# Patient Record
Sex: Female | Born: 1945 | Race: White | Hispanic: No | Marital: Married | Smoking: Never smoker
Health system: Southern US, Community
[De-identification: ages and names within clinical notes are randomized; demographics above are authoritative.]

## PROBLEM LIST (undated history)

## (undated) DIAGNOSIS — I491 Atrial premature depolarization: Secondary | ICD-10-CM

## (undated) DIAGNOSIS — I447 Left bundle-branch block, unspecified: Secondary | ICD-10-CM

## (undated) DIAGNOSIS — R011 Cardiac murmur, unspecified: Secondary | ICD-10-CM

## (undated) DIAGNOSIS — Z90710 Acquired absence of both cervix and uterus: Secondary | ICD-10-CM

## (undated) DIAGNOSIS — E785 Hyperlipidemia, unspecified: Secondary | ICD-10-CM

## (undated) DIAGNOSIS — I471 Supraventricular tachycardia, unspecified: Secondary | ICD-10-CM

## (undated) DIAGNOSIS — E079 Disorder of thyroid, unspecified: Secondary | ICD-10-CM

## (undated) DIAGNOSIS — J45909 Unspecified asthma, uncomplicated: Secondary | ICD-10-CM

## (undated) DIAGNOSIS — Z87898 Personal history of other specified conditions: Secondary | ICD-10-CM

## (undated) DIAGNOSIS — I493 Ventricular premature depolarization: Secondary | ICD-10-CM

## (undated) DIAGNOSIS — I272 Pulmonary hypertension, unspecified: Secondary | ICD-10-CM

## (undated) HISTORY — DX: Ventricular premature depolarization: I49.3

## (undated) HISTORY — DX: Acquired absence of both cervix and uterus: Z90.710

## (undated) HISTORY — DX: Supraventricular tachycardia: I47.1

## (undated) HISTORY — DX: Atrial premature depolarization: I49.1

## (undated) HISTORY — DX: Personal history of other specified conditions: Z87.898

## (undated) HISTORY — DX: Left bundle-branch block, unspecified: I44.7

## (undated) HISTORY — DX: Disorder of thyroid, unspecified: E07.9

## (undated) HISTORY — DX: Supraventricular tachycardia, unspecified: I47.10

## (undated) HISTORY — DX: Unspecified asthma, uncomplicated: J45.909

## (undated) HISTORY — DX: Cardiac murmur, unspecified: R01.1

## (undated) HISTORY — DX: Pulmonary hypertension, unspecified: I27.20

## (undated) HISTORY — DX: Hyperlipidemia, unspecified: E78.5

---

## 2009-12-29 ENCOUNTER — Encounter: Payer: Self-pay | Admitting: Cardiology

## 2010-01-31 ENCOUNTER — Encounter: Payer: Self-pay | Admitting: Cardiology

## 2010-02-07 ENCOUNTER — Ambulatory Visit: Payer: Self-pay | Admitting: Cardiology

## 2010-02-07 DIAGNOSIS — R011 Cardiac murmur, unspecified: Secondary | ICD-10-CM | POA: Insufficient documentation

## 2010-02-07 DIAGNOSIS — I447 Left bundle-branch block, unspecified: Secondary | ICD-10-CM

## 2010-02-17 ENCOUNTER — Encounter: Payer: Self-pay | Admitting: Cardiology

## 2010-02-17 ENCOUNTER — Ambulatory Visit: Payer: Self-pay

## 2010-02-17 ENCOUNTER — Ambulatory Visit (HOSPITAL_COMMUNITY): Admission: RE | Admit: 2010-02-17 | Discharge: 2010-02-17 | Payer: Self-pay | Admitting: Cardiology

## 2010-02-17 ENCOUNTER — Ambulatory Visit: Payer: Self-pay | Admitting: Cardiology

## 2010-02-23 ENCOUNTER — Telehealth: Payer: Self-pay | Admitting: Cardiology

## 2010-05-18 NOTE — Progress Notes (Signed)
Summary: need something written - clearance for surgery  Phone Note From Other Clinic   Caller: sherry office [phone] fx [phone]  Request: Talk with Nurse Details of Complaint: need something in written pt is medical clear for surgery.  Initial call taken by: Lorne Skeens,  February 23, 2010 4:17 PM  Follow-up for Phone Call        PT CALLING AGAIN REGARDING GETTING SURGIACL CLEARANCE FAXED TO DR.[facility] GNF:AOZHYQ WHEELS [phone] Judie Grieve  February 23, 2010 4:26 PM    Faxed Echo from 02/17/10 over to [facility] @ [phone] [facility] [place]  February 23, 2010 4:51 PM  Appended Document: need something written - clearance for surgery We can write something for them.

## 2010-05-18 NOTE — Assessment & Plan Note (Signed)
Summary: NP6/NEW LBBB/JML   Visit Type:  new pt visit Referring Saddie Sandeen:  Dr. Amanda Pea Primary Niaya Hickok:  Lindwood Qua  CC:  pt here today for abnormal EKG (LBBB)..pt states she has had some sob though she does have asthma...edema/ankles last week....denies any cp.  History of Present Illness: 65 yo with history of asthma, hyperlipidemia, and hypothyroidism presents to cardiology clinic for evaluation of LBBB.  Patient had been set up for an elective carpal tunnel surgery today.  ECG done today pre-operatively showed a LBBB.  She had not had a prior in the [facility] system.  She was referred here today for cardiology evaluation prior to surgery. We called over to her PCP's office and actually obtained an ECG this morning.  The tracings were too light to see well, but the interpretation was left bundle branch block.  This was from 2001.  The doctor's note at the top of the ECG indicated that LBBB was also present on an ECG done in 1992.    Patient has minimal cardiopulmonary symptoms.  She is not very active, however.  She does housework without trouble.  She has never had chest pain or tightness.  She is mildly short of breath after climbing a flight of steps.  She gets occasional "dizzy spells," which sound like orthostatic-type lightheadedness, if she rises too fast from lying or sitting.    ECG: NSR, LBBB  Labs (10/11): HCT 35.6, creatinine 0.42, K 4.0, HDL 68, LDL 77, TSH normal  Current Medications (verified): 1)  Singulair 10 Mg Tabs (Montelukast Sodium) .Marland Kitchen.. 1 Tab At Bedtime 2)  Zyrtec Allergy 10 Mg Tabs (Cetirizine Hcl) .Marland Kitchen.. 1 Tab At Bedtime 3)  Premarin 0.625 Mg Tabs (Estrogens Conjugated) .Marland Kitchen.. 1 Tab At Bedtime 4)  Simvastatin 40 Mg Tabs (Simvastatin) .Marland Kitchen.. 1 Tab At Bedtime 5)  Levothyroxine Sodium 88 Mcg Tabs (Levothyroxine Sodium) .Marland Kitchen.. 1 Tab At Bedtime 6)  Vitamin C 500 Mg Tabs (Ascorbic Acid) .Marland Kitchen.. 1 Tab At Bedtime 7)  Iron 325 (65 Fe) Mg Tabs (Ferrous Sulfate) .Marland Kitchen.. 1 Tab At  Bedtime 8)  Ventolin Hfa 108 (90 Base) Mcg/act Aers (Albuterol Sulfate) .... As Needed 9)  Advair Diskus 250-50 Mcg/dose Aepb (Fluticasone-Salmeterol) .Marland Kitchen.. 1 Puff Two Times A Day  Allergies (verified): 1)  ! Codeine 2)  ! Pcn  Past History:  Past Medical History: 1.  Asthma: Since childhood 2.  Allergic rhinitis 3.  Hyperlipidemia 4.  Hypothyroidism 5.  s/p hysterectomy 6.   h/o vertigo 7.  "heart murmur"  Family History: Father with MI at 61, mother with cancer  Social History: Never smoked.  Works in an Scientist, [employer].  Married, lives in [place] outside of [place].   Vital Signs:  Patient profile:   65 year old female Height:      59 inches Weight:      134.8 pounds BMI:     27.32 Pulse rate:   90 / minute Pulse rhythm:   irregular BP sitting:   98 / 64  (left arm) Cuff size:   large  Vitals Entered By: Danielle Rankin, CMA (February 07, 2010 9:22 AM)  Physical Exam  General:  Well developed, well nourished, in no acute distress. Head:  normocephalic and atraumatic Nose:  no deformity, discharge, inflammation, or lesions Mouth:  Teeth, gums and palate normal. Oral mucosa normal. Neck:  Neck supple, no JVD. No masses, thyromegaly or abnormal cervical nodes. Lungs:  Clear bilaterally to auscultation and percussion. Heart:  Non-displaced PMI, chest  non-tender; regular rate and rhythm, S1, S2 without rubs or gallops. 2/6 systolic crescendo-decrescendo murmur RUSB.  Carotid upstroke normal, no bruit.  Pedals normal pulses. No edema, no varicosities. Abdomen:  Bowel sounds positive; abdomen soft and non-tender without masses, organomegaly, or hernias noted. No hepatosplenomegaly. Extremities:  No clubbing or cyanosis. Neurologic:  Alert and oriented x 3. Skin:  Intact without lesions or rashes. Psych:  Normal affect.   Impression & Recommendations:  Problem # 1:  LBBB (ICD-426.3) The left bundle branch block appears to be old, dating back, it seems,  to at least 10.  She has no significant cardiopulmonary symptoms.  She is able to do > 4 METS of exertion without much trouble.  I do not think that she needs a stress test prior to surgery given her lack of active symptoms.  I think that surgery can be rescheduled.   Problem # 2:  MURMUR (ICD-785.2) Patient has a systolic murmur in the aortic area.  I suspect that this may be aortic stenosis-related, but doubt severe aortic stenosis (sounds like mild AS or aortic sclerosis).  I will get an echocardiogram to assess.    Other Orders: Nuclear Stress Test (Nuc Stress Test) Echocardiogram (Echo)  Patient Instructions: 1)  Your physician has requested that you have an echocardiogram.  Echocardiography is a painless test that uses sound waves to create images of your heart. It provides your doctor with information about the size and shape of your heart and how well your heart's chambers and valves are working.  This procedure takes approximately one hour. There are no restrictions for this procedure. 2)  Your physician recommends that you schedule a follow-up appointment as needed with Dr Shirlee Latch.

## 2010-05-18 NOTE — Letter (Signed)
Summary: Clinic Note  Clinic Note   Imported By: Marylou Mccoy 03/06/2010 10:02:49  _____________________________________________________________________  External Attachment:    Type:   Image     Comment:   External Document

## 2013-09-11 ENCOUNTER — Ambulatory Visit (INDEPENDENT_AMBULATORY_CARE_PROVIDER_SITE_OTHER): Payer: Medicare Other | Admitting: Cardiology

## 2013-09-11 ENCOUNTER — Encounter: Payer: Self-pay | Admitting: *Deleted

## 2013-09-11 ENCOUNTER — Encounter: Payer: Self-pay | Admitting: Cardiology

## 2013-09-11 ENCOUNTER — Encounter (INDEPENDENT_AMBULATORY_CARE_PROVIDER_SITE_OTHER): Payer: Self-pay

## 2013-09-11 ENCOUNTER — Telehealth: Payer: Self-pay | Admitting: *Deleted

## 2013-09-11 VITALS — BP 122/64 | HR 88 | Ht 59.0 in | Wt 134.0 lb

## 2013-09-11 DIAGNOSIS — E785 Hyperlipidemia, unspecified: Secondary | ICD-10-CM

## 2013-09-11 DIAGNOSIS — G4710 Hypersomnia, unspecified: Secondary | ICD-10-CM

## 2013-09-11 DIAGNOSIS — I447 Left bundle-branch block, unspecified: Secondary | ICD-10-CM

## 2013-09-11 DIAGNOSIS — R5383 Other fatigue: Secondary | ICD-10-CM | POA: Insufficient documentation

## 2013-09-11 DIAGNOSIS — I2789 Other specified pulmonary heart diseases: Secondary | ICD-10-CM

## 2013-09-11 DIAGNOSIS — I272 Pulmonary hypertension, unspecified: Secondary | ICD-10-CM

## 2013-09-11 DIAGNOSIS — R5381 Other malaise: Secondary | ICD-10-CM

## 2013-09-11 DIAGNOSIS — R0602 Shortness of breath: Secondary | ICD-10-CM | POA: Insufficient documentation

## 2013-09-11 LAB — BASIC METABOLIC PANEL
BUN: 13 mg/dL (ref 6–23)
CHLORIDE: 104 meq/L (ref 96–112)
CO2: 29 meq/L (ref 19–32)
Calcium: 9.5 mg/dL (ref 8.4–10.5)
Creatinine, Ser: 0.6 mg/dL (ref 0.4–1.2)
GFR: 114.09 mL/min (ref >60.00)
GLUCOSE: 111 mg/dL — AB (ref 70–99)
Potassium: 3.6 mEq/L (ref 3.5–5.1)
Sodium: 138 mEq/L (ref 135–145)

## 2013-09-11 LAB — BRAIN NATRIURETIC PEPTIDE: Pro B Natriuretic peptide (BNP): 15 pg/mL (ref 0.0–100.0)

## 2013-09-11 MED ORDER — DILTIAZEM HCL ER COATED BEADS 180 MG PO CP24: 180.0000 mg | ORAL_CAPSULE | Freq: Every day | ORAL | Status: DC

## 2013-09-11 MED ORDER — ATORVASTATIN CALCIUM 20 MG PO TABS: 20.0000 mg | ORAL_TABLET | Freq: Every day | ORAL | Status: DC

## 2013-09-11 NOTE — Telephone Encounter (Signed)
Pt aware and pharmacy aware. Meds updated and labs ordered.

## 2013-09-11 NOTE — Telephone Encounter (Signed)
Received a call from [facility]. Rx for Diltiazem sent over and there is an interaction with Simvastatin. Advised Dr Mayford Knife out of the office to hold Simvastatin and start Diltiazem until we call back Monday, Larita Fife will let patient know.  Marylene Land will be pharmacist Monday.

## 2013-09-11 NOTE — Progress Notes (Signed)
[address], [address] [place], [place]  [address] Phone: [phone] Fax:  [phone]  Date:  09/11/2013   ID:  Adrienne Gonzales, DOB [birth year], MRN [id number]  PCP:  No primary provider on file.  Cardiologist:  Armanda Magic, MD     History of Present Illness: Adrienne Gonzales is a 68 y.o. female with a recent trip to [place] and starting having fatigue and feeling like she was giving out. She says that her symptoms have improved some since she got home.  She denies any chest pain.  She has asthma so she occasionally has some SOB but it has been stable.   She was also having dizzy spells.  She says that it occurs only for 1-2 seconds and resolves.  Echo showed mild pulmonary HTN with PASP of 45-59mmHg, LVH, diastolic dysfunction, normal LVF EF 55-60^, MAC with trivial MR, dilated LA, normal RVF, mild TR.  She also wore an event monitor which showed NSR with rare PVC's and PAC's and SVT up to 11 beats at 191bpm.  Average HR was 91bpm.  She is now here for further evaluation.  She is feeling better with less fatigue.  She was started on Vasotec for hypertensive heart disease.   Her husband says that she has loud snoring and episodes of gasping for breath.  She occasionally has nonrestorative sleep.     Wt Readings from Last 3 Encounters:  09/11/13 134 lb (60.782 kg)  02/07/10 134 lb 12.8 oz (61.145 kg)     Past Medical History  Diagnosis Date  . Asthma   . Thyroid disease     synthroid  . HLD (hyperlipidemia)   . Heart murmur   . H/O: hysterectomy   . History of vertigo   . LBBB (left bundle branch block)     Current Outpatient Prescriptions  Medication Sig Dispense Refill  . benzonatate (TESSALON) 100 MG capsule Take by mouth 3 (three) times daily as needed for cough.      Marland Kitchen Bioflavonoid Products (ESTER C PO) Take 500 mg by mouth daily.      . budesonide-formoterol (SYMBICORT) 80-4.5 MCG/ACT inhaler Inhale 2 puffs into the lungs 2 (two) times daily.      . cetirizine  (ZYRTEC) 10 MG tablet Take 10 mg by mouth daily.      . Cholecalciferol (VITAMIN D3) 2000 UNITS TABS Take by mouth daily.      Marland Kitchen desonide (DESOWEN) 0.05 % cream Apply topically 2 (two) times daily.      . enalapril (VASOTEC) 5 MG tablet Take 5 mg by mouth daily.      Marland Kitchen estradiol (ESTRACE) 0.5 MG tablet Take 0.5 mg by mouth daily.      . ferrous fumarate (FERROCITE) 325 (106 FE) MG TABS tablet Take 1 tablet by mouth.      . Fluticasone-Salmeterol (ADVAIR) 500-50 MCG/DOSE AEPB Inhale 1 puff into the lungs 2 (two) times daily.      Marland Kitchen ibuprofen (ADVIL,MOTRIN) 200 MG tablet Take 200 mg by mouth every 6 (six) hours as needed.      Marland Kitchen levothyroxine (SYNTHROID, LEVOTHROID) 100 MCG tablet Take 100 mcg by mouth daily before breakfast.      . mometasone (NASONEX) 50 MCG/ACT nasal spray Place 2 sprays into the nose daily.      . montelukast (SINGULAIR) 10 MG tablet Take 10 mg by mouth at bedtime.      . Omega-3 Fatty Acids (FISH OIL) 1200 MG CAPS Take by  mouth daily.      . pantoprazole (PROTONIX) 40 MG tablet Take 40 mg by mouth daily.      . simvastatin (ZOCOR) 40 MG tablet Take 40 mg by mouth daily.       No current facility-administered medications for this visit.    Allergies:    Allergies  Allergen Reactions  . Codeine     REACTION: N/V  . Penicillins     REACTION: N/V    Social History:  The patient  reports that she has never smoked. She does not have any smokeless tobacco history on file. She reports that she does not drink alcohol or use illicit drugs.   Family History:  The patient's family history includes Cancer in her mother; Heart attack in her father.   ROS:  Please see the history of present illness.      All other systems reviewed and negative.   PHYSICAL EXAM: VS:  BP 122/64  Pulse 88  Ht 4\' 11"  (1.499 m)  Wt 134 lb (60.782 kg)  BMI 27.05 kg/m2 Well nourished, well developed, in no acute distress HEENT: normal Neck: no JVD Cardiac:  normal S1, S2; RRR; 2/6 SM at  RUSB Lungs:  clear to auscultation bilaterally, no wheezing, rhonchi or rales Abd: soft, nontender, no hepatomegaly Ext: no edema Skin: warm and dry Neuro:  CNs 2-12 intact, no focal abnormalities noted  EKG:     NSR with LBBB  ASSESSMENT AND PLAN:  1.  Exertional fatigue  - check Stress myoview to rule out ischemia 2.  Chronic LBBB - this was documented back in 2011 and by EKG in 1992 3.  Nonsustained SVT up to 11 beats with PAC's and PVC's on recent heart monitor 4.  Mild to moderate Pulmonary HTN by echo PASP 45-3150mmHg - this may be from underlying diastolic dysfunction but need to consider other etiologies such as chronic PE and sleep apnea.   - Sleep study to rule out OSA - Chest CT angio to rule out chronic PE 5.  Chronic SOB from asthma but may also be exacerbated by her diastolic dysfunction.  I will stop her Vasotec that she was started on and start Cardizem CD 180mg  daily to help with diastolic dysfunction and reduced HR and allow for increased diastolic filling time.  This will also help control her PVC's and PAC's and nonsustained SVT.  I will check a BNP.  Followup with me in 4 weeks   Signed, Armanda Magicraci Xinyi Batton, MD 09/11/2013 10:57 AM

## 2013-09-11 NOTE — Telephone Encounter (Signed)
Jeremy, please advise.  

## 2013-09-11 NOTE — Patient Instructions (Addendum)
Your physician has recommended you make the following change in your medication:   1. STOP VASOTEC.  2. START CARDIZEM CD 180 MG DAILY.   Your physician recommends that you return for lab work TODAY FOR  BNP AND BMET.  Your physician has recommended that you have a sleep study at [facility]. This test records several body functions during sleep, including: brain activity, eye movement, oxygen and carbon dioxide blood levels, heart rate and rhythm, breathing rate and rhythm, the flow of air through your mouth and nose, snoring, body muscle movements, and chest and belly movement.  Your physician has requested that you have en exercise stress myoview. For further information please visit [email or web address] Please follow instruction sheet, as given.  Chest CT Angiography (CTA), is a special type of CT scan that uses a computer to produce multi-dimensional views of major blood vessels throughout the body. In CT angiography, a contrast material is injected through an IV to help visualize the blood vessels  Your physician recommends that you schedule a follow-up appointment in: 4 WEEKS WITH DR. Mayford Knife.

## 2013-09-11 NOTE — Telephone Encounter (Signed)
Patient needs to discontinue simvastatin and change to atorvastatin 20 mg once daily.  This interaction doesn't exist with atorvastatin.  Recheck lipid panel and hepatic panel 6-8 weeks later.  Please notify patient and pharmacy, and set up lab. Thanks.

## 2013-09-14 ENCOUNTER — Inpatient Hospital Stay: Admission: RE | Admit: 2013-09-14 | Payer: Medicare Other | Source: Ambulatory Visit

## 2013-09-15 ENCOUNTER — Ambulatory Visit (INDEPENDENT_AMBULATORY_CARE_PROVIDER_SITE_OTHER)
Admission: RE | Admit: 2013-09-15 | Discharge: 2013-09-15 | Disposition: A | Discharge status: Home / Self Care | Payer: Medicare Other | Source: Ambulatory Visit | Attending: Cardiology | Admitting: Cardiology

## 2013-09-15 DIAGNOSIS — R0602 Shortness of breath: Secondary | ICD-10-CM

## 2013-09-15 DIAGNOSIS — I2789 Other specified pulmonary heart diseases: Secondary | ICD-10-CM

## 2013-09-15 MED ORDER — IOHEXOL 350 MG/ML SOLN
80.0000 mL | Freq: Once | INTRAVENOUS | Status: AC | PRN
  Administered 2013-09-15: 80 mL via INTRAVENOUS

## 2013-09-29 ENCOUNTER — Ambulatory Visit (HOSPITAL_COMMUNITY): Payer: Medicare Other | Attending: Cardiology | Admitting: Radiology

## 2013-09-29 VITALS — BP 86/60 | Ht 59.0 in | Wt 125.0 lb

## 2013-09-29 DIAGNOSIS — Z8249 Family history of ischemic heart disease and other diseases of the circulatory system: Secondary | ICD-10-CM | POA: Insufficient documentation

## 2013-09-29 DIAGNOSIS — R002 Palpitations: Secondary | ICD-10-CM | POA: Insufficient documentation

## 2013-09-29 DIAGNOSIS — R0602 Shortness of breath: Secondary | ICD-10-CM | POA: Insufficient documentation

## 2013-09-29 DIAGNOSIS — R5383 Other fatigue: Secondary | ICD-10-CM

## 2013-09-29 DIAGNOSIS — R0989 Other specified symptoms and signs involving the circulatory and respiratory systems: Secondary | ICD-10-CM | POA: Insufficient documentation

## 2013-09-29 DIAGNOSIS — I447 Left bundle-branch block, unspecified: Secondary | ICD-10-CM

## 2013-09-29 DIAGNOSIS — J45909 Unspecified asthma, uncomplicated: Secondary | ICD-10-CM | POA: Insufficient documentation

## 2013-09-29 DIAGNOSIS — I272 Pulmonary hypertension, unspecified: Secondary | ICD-10-CM

## 2013-09-29 DIAGNOSIS — I4949 Other premature depolarization: Secondary | ICD-10-CM

## 2013-09-29 DIAGNOSIS — R0609 Other forms of dyspnea: Secondary | ICD-10-CM | POA: Insufficient documentation

## 2013-09-29 DIAGNOSIS — R42 Dizziness and giddiness: Secondary | ICD-10-CM | POA: Insufficient documentation

## 2013-09-29 MED ORDER — TECHNETIUM TC 99M SESTAMIBI GENERIC - CARDIOLITE
10.0000 | Freq: Once | INTRAVENOUS | Status: AC | PRN
  Administered 2013-09-29: 10 via INTRAVENOUS

## 2013-09-29 MED ORDER — TECHNETIUM TC 99M SESTAMIBI GENERIC - CARDIOLITE
30.0000 | Freq: Once | INTRAVENOUS | Status: AC | PRN
  Administered 2013-09-29: 30 via INTRAVENOUS

## 2013-09-29 MED ORDER — ADENOSINE (DIAGNOSTIC) 3 MG/ML IV SOLN
0.5600 mg/kg | Freq: Once | INTRAVENOUS | Status: AC
  Administered 2013-09-29: 31.8 mg via INTRAVENOUS

## 2013-09-29 NOTE — Progress Notes (Signed)
[facility] SITE 3 NUCLEAR MED [address] [place], [place] [id number] [phone]    Cardiology Nuclear Med Study  Adrienne Gonzales is a 68 y.o. female     MRN : [id number]     DOB: 02/21/1946  Procedure Date: 09/29/2013  Nuclear Med Background Indication for Stress Test:  Evaluation for Ischemia History:  Asthma and No H/O CAD '15 ECHO: EF 55-60% Cardiac Risk Factors: Family History - CAD, LBBB and Lipids  Symptoms:  Dizziness, DOE, Fatigue, Palpitations and SOB   Nuclear Pre-Procedure Caffeine/Decaff Intake:  None> 12 hrs NPO After: 7:30pm   Lungs:  clear O2 Sat: 98% on room air. IV 0.9% NS with Angio Cath:  22g  IV Site: R Wrist x 1, tolerated well IV Started by:  Irean HongPatsy Suhayla Chisom, RN  Chest Size (in):  36 Cup Size: C  Height: 4\' 11"  (1.499 m)  Weight:  125 lb (56.7 kg)  BMI:  Body mass index is 25.23 kg/(m^2). Tech Comments:  Patient held Cardizem, but took Advair and Symbicort this am. Irean HongPatsy Caci Orren, RN.    Nuclear Med Study 1 or 2 day study: 1 day  Stress Test Type:  Adenosine  Reading MD: N/A  Order Authorizing Provider:  Armanda Magicraci Turner, MD  Resting Radionuclide: Technetium 172m Sestamibi  Resting Radionuclide Dose: 11.0 mCi   Stress Radionuclide:  Technetium 6872m Sestamibi  Stress Radionuclide Dose: 33.0 mCi           Stress Protocol Rest HR: 93 Stress HR: 114  Rest BP: 86/60 Stress BP: 118/70  Exercise Time (min): n/a METS: n/a   Predicted Max HR: 151 bpm % Max HR: 75.5 bpm Rate Pressure Product: [id number]   Dose of Adenosine (mg):  31.8 Dose of Lexiscan: n/a mg  Dose of Atropine (mg): n/a Dose of Dobutamine: n/a mcg/kg/min (at max HR)  Stress Test Technologist: Milana NaSabrina Williams, EMT-P  Nuclear Technologist:  Domenic PoliteStephen Carbone, CNMT     Rest Procedure:  Myocardial perfusion imaging was performed at rest 45 minutes following the intravenous administration of Technetium 7472m Sestamibi. Rest ECG: NSR-LBBB  Stress Procedure:  The patient received IV  adenosine at 140 mcg/kg/min for 4 minutes.  Technetium 2072m Sestamibi was injected at the 2 minute mark and quantitative spect images were obtained after a 45 minute delay. Stress ECG: No significant change from baseline ECG  QPS Raw Data Images:  There was significant bowel loop attenuation along the inferior aspect of stress images, less pronounced on rest images. Stress Images:  There is decreased uptake along the apical/distal anterior/distal inferior distribution however interpretation is markedly limited by bowel loop attenuation obscuring the majority of the inferior wall. Rest Images:  There appears to be decreased relative uptake along the distal anteroseptal, inferoseptal/apical wall distribution. Limited by bowel loop attenuation. Subtraction (SDS):  SDS is reported as 8 however I believe this to be spurious given the large amount of attenuation artifact. Transient Ischemic Dilatation (Normal <1.22):  1.00 Lung/Heart Ratio (Normal <0.45):  0.29  Quantitative Gated Spect Images QGS EDV:  59 ml QGS ESV:  21 ml  Impression Exercise Capacity:  Adenosine study with no exercise. BP Response:  Normal blood pressure response. Clinical Symptoms:  Typical symptoms with adenosine, chest and arm fullness as well as headache ECG Impression:  Baseline:  LBBB.  EKG uninterpretable due to LBBB at rest and stress. Comparison with Prior Nuclear Study: No images to compare  Overall Impression:  Markedly limited study secondary to bowel loop attenuation especially  in stress images obscuring the majority of the inferior wall.    LV Ejection Fraction: 64%.  LV Wall Motion:  Septal wall hypokinesis consistent with left bundle branch block  Donato SchultzSKAINS, MARK, MD

## 2013-10-01 ENCOUNTER — Telehealth: Payer: Self-pay | Admitting: General Surgery

## 2013-10-01 DIAGNOSIS — I4891 Unspecified atrial fibrillation: Secondary | ICD-10-CM

## 2013-10-01 NOTE — Telephone Encounter (Signed)
To Dr Mayford Knifeurner as Lorain ChildesFYI. Pt is set up for Cardioversion and labs prior. THe EKG done today will be on your cart to sign off on to be scanned in.

## 2013-10-05 ENCOUNTER — Telehealth: Payer: Self-pay | Admitting: Cardiology

## 2013-10-05 ENCOUNTER — Other Ambulatory Visit: Payer: Self-pay | Admitting: General Surgery

## 2013-10-05 DIAGNOSIS — I447 Left bundle-branch block, unspecified: Secondary | ICD-10-CM

## 2013-10-05 DIAGNOSIS — R0602 Shortness of breath: Secondary | ICD-10-CM

## 2013-10-05 NOTE — Telephone Encounter (Signed)
New message      [facility] to [facility].  You spoke with the patient earlier today---now the daughter want to talk to you

## 2013-10-05 NOTE — Telephone Encounter (Signed)
Spoke with daughter and explained why pt needed procedure. Pt said it was all right to talk to daughter Larita FifeLynn.

## 2013-10-08 ENCOUNTER — Encounter: Payer: Self-pay | Admitting: Cardiology

## 2013-10-13 ENCOUNTER — Ambulatory Visit (HOSPITAL_COMMUNITY)
Admission: RE | Admit: 2013-10-13 | Discharge: 2013-10-13 | Disposition: A | Discharge status: Home / Self Care | Payer: Medicare Other | Source: Ambulatory Visit | Attending: Cardiology | Admitting: Cardiology

## 2013-10-13 DIAGNOSIS — R0602 Shortness of breath: Secondary | ICD-10-CM

## 2013-10-13 DIAGNOSIS — I447 Left bundle-branch block, unspecified: Secondary | ICD-10-CM

## 2013-10-13 DIAGNOSIS — R5383 Other fatigue: Principal | ICD-10-CM

## 2013-10-13 DIAGNOSIS — R5381 Other malaise: Secondary | ICD-10-CM | POA: Insufficient documentation

## 2013-10-13 MED ORDER — METOPROLOL TARTRATE 1 MG/ML IV SOLN
INTRAVENOUS | Status: AC
  Filled 2013-10-13: qty 5

## 2013-10-13 MED ORDER — NITROGLYCERIN 0.4 MG SL SUBL
SUBLINGUAL_TABLET | SUBLINGUAL | Status: AC
  Administered 2013-10-13: 0.4 mg via SUBLINGUAL
  Filled 2013-10-13: qty 1

## 2013-10-13 MED ORDER — METOPROLOL TARTRATE 1 MG/ML IV SOLN
5.0000 mg | Freq: Once | INTRAVENOUS | Status: AC
  Administered 2013-10-13: 5 mg via INTRAVENOUS
  Filled 2013-10-13: qty 5

## 2013-10-13 MED ORDER — IOHEXOL 350 MG/ML SOLN
80.0000 mL | Freq: Once | INTRAVENOUS | Status: AC | PRN
  Administered 2013-10-13: 80 mL via INTRAVENOUS

## 2013-10-13 MED ORDER — NITROGLYCERIN 0.4 MG SL SUBL
0.4000 mg | SUBLINGUAL_TABLET | SUBLINGUAL | Status: DC | PRN
  Administered 2013-10-13: 0.4 mg via SUBLINGUAL
  Filled 2013-10-13: qty 25

## 2013-10-13 NOTE — Progress Notes (Signed)
Pt moved from radiology nurses station to CT room 1 for CTA. Dr. Delton SeeNelson notified. Heart rate now 74.

## 2013-10-13 NOTE — Progress Notes (Signed)
Pt to nurses station post CTA, tolerated well, eating graham crackers and drinking water without difficulty, IV removed. Husband at side. Discharged to home.

## 2013-10-21 ENCOUNTER — Encounter: Payer: Medicare Other | Admitting: Cardiology

## 2013-10-22 NOTE — Progress Notes (Signed)
This encounter was created in error - please disregard.

## 2013-11-02 ENCOUNTER — Other Ambulatory Visit (INDEPENDENT_AMBULATORY_CARE_PROVIDER_SITE_OTHER): Payer: Medicare Other

## 2013-11-02 DIAGNOSIS — E785 Hyperlipidemia, unspecified: Secondary | ICD-10-CM

## 2013-11-02 LAB — HEPATIC FUNCTION PANEL
ALBUMIN: 3.7 g/dL (ref 3.5–5.2)
ALT: 14 U/L (ref 0–35)
AST: 18 U/L (ref 0–37)
Alkaline Phosphatase: 52 U/L (ref 39–117)
Bilirubin, Direct: 0 mg/dL (ref 0.0–0.3)
TOTAL PROTEIN: 6.5 g/dL (ref 6.0–8.3)
Total Bilirubin: 0.5 mg/dL (ref 0.2–1.2)

## 2013-11-02 LAB — LIPID PANEL
Cholesterol: 145 mg/dL (ref 0–200)
HDL: 63 mg/dL (ref >39.00)
LDL Cholesterol: 70 mg/dL (ref 0–99)
NonHDL: 82
TRIGLYCERIDES: 58 mg/dL (ref 0.0–149.0)
Total CHOL/HDL Ratio: 2
VLDL: 11.6 mg/dL (ref 0.0–40.0)

## 2013-11-03 ENCOUNTER — Encounter: Payer: Self-pay | Admitting: General Surgery

## 2013-11-05 ENCOUNTER — Encounter: Payer: Self-pay | Admitting: Cardiology

## 2013-11-05 ENCOUNTER — Telehealth: Payer: Self-pay | Admitting: Cardiology

## 2013-11-05 NOTE — Telephone Encounter (Signed)
New message ° ° ° ° °Want lab results °

## 2013-11-05 NOTE — Telephone Encounter (Signed)
Lmtrc. Lipid panel and hepatic panel normal. Mailed results.

## 2013-11-16 ENCOUNTER — Ambulatory Visit: Payer: Medicare Other | Admitting: Physician Assistant

## 2013-11-23 ENCOUNTER — Encounter: Payer: Self-pay | Admitting: Physician Assistant

## 2013-11-23 ENCOUNTER — Ambulatory Visit (INDEPENDENT_AMBULATORY_CARE_PROVIDER_SITE_OTHER): Payer: Medicare Other | Admitting: Physician Assistant

## 2013-11-23 VITALS — BP 115/63 | HR 81 | Ht 59.0 in | Wt 131.0 lb

## 2013-11-23 DIAGNOSIS — I471 Supraventricular tachycardia: Secondary | ICD-10-CM | POA: Insufficient documentation

## 2013-11-23 DIAGNOSIS — I498 Other specified cardiac arrhythmias: Secondary | ICD-10-CM

## 2013-11-23 DIAGNOSIS — R0602 Shortness of breath: Secondary | ICD-10-CM

## 2013-11-23 NOTE — Assessment & Plan Note (Signed)
SVT seems to have improved on diltiazem. Continue current medications. Stop Vasotec.

## 2013-11-23 NOTE — Assessment & Plan Note (Signed)
Blood pressure currently controlled.

## 2013-11-23 NOTE — Patient Instructions (Signed)
Your physician recommends that you continue on your current medications as directed. Please refer to the Current Medication list given to you today.  Your physician recommends that you schedule a follow-up appointment in: DR TURNER IN 2 TO 3 MONTHS

## 2013-11-23 NOTE — Progress Notes (Signed)
HPI: This is a 68 year old female patient of Dr. Mayford Knife who had chest pain and had a Lexi scan that was suboptimal due to to increase gut activity. She then had CTA with morphology and calcium score which was 0. She also had SVT up to 11 beats at a rate of 191 beats per minute on the monitor and was started on Cardizem. 2-D echo showed mild pulmonary hypertension with PAS P. of 45-50 mmHg, and LVH, diastolic dysfunction, EF 55-60%.  Patient is here for followup. She denies any further palpitations or dizzy spells. She feels much better. She is a little confused over her medications and accidentally took Vasotec in addition to the diltiazem. She then realized he probably shouldn't be taking both.   Allergies-- Barbiturates -- Nausea Only  -- Ciprofloxacin -- Nausea Only and Nausea And Vomiting   --  SICK ON STOMACH  -- Codeine    --  REACTION: N/V  -- Penicillins    --  REACTION: N/V  -- Percocet [Oxycodone-Acetaminophen]   Current Outpatient Prescriptions on File Prior to Visit: atorvastatin (LIPITOR) 20 MG tablet, Take 1 tablet (20 mg total) by mouth daily., Disp: 30 tablet, Rfl: 6 Bioflavonoid Products (ESTER C PO), Take 500 mg by mouth daily., Disp: , Rfl:  budesonide-formoterol (SYMBICORT) 80-4.5 MCG/ACT inhaler, Inhale 2 puffs into the lungs 2 (two) times daily., Disp: , Rfl:  cetirizine (ZYRTEC) 10 MG tablet, Take 10 mg by mouth daily., Disp: , Rfl:  Cholecalciferol (VITAMIN D3) 2000 UNITS TABS, Take by mouth daily., Disp: , Rfl:  desonide (DESOWEN) 0.05 % cream, Apply topically 2 (two) times daily., Disp: , Rfl:  diltiazem (CARDIZEM CD) 180 MG 24 hr capsule, Take 1 capsule (180 mg total) by mouth daily., Disp: 30 capsule, Rfl: 6 enalapril (VASOTEC) 5 MG tablet, Take 5 mg by mouth., Disp: , Rfl:  estradiol (ESTRACE) 0.5 MG tablet, Take 0.5 mg by mouth daily., Disp: , Rfl:  ferrous fumarate (FERROCITE) 325 (106 FE) MG TABS tablet, Take 1 tablet by mouth., Disp: , Rfl:   Fluticasone-Salmeterol (ADVAIR) 500-50 MCG/DOSE AEPB, Inhale 1 puff into the lungs 2 (two) times daily., Disp: , Rfl:  ibuprofen (ADVIL,MOTRIN) 200 MG tablet, Take 200 mg by mouth every 6 (six) hours as needed., Disp: , Rfl:  levothyroxine (SYNTHROID, LEVOTHROID) 100 MCG tablet, Take 100 mcg by mouth daily before breakfast., Disp: , Rfl:  mometasone (NASONEX) 50 MCG/ACT nasal spray, Place 2 sprays into the nose daily., Disp: , Rfl:  montelukast (SINGULAIR) 10 MG tablet, Take 10 mg by mouth at bedtime., Disp: , Rfl:  Omega-3 Fatty Acids (FISH OIL) 1200 MG CAPS, Take by mouth daily., Disp: , Rfl:  pantoprazole (PROTONIX) 40 MG tablet, Take 40 mg by mouth daily., Disp: , Rfl:  vitamin C (ASCORBIC ACID) 500 MG tablet, Take 500 mg by mouth., Disp: , Rfl:   No current facility-administered medications on file prior to visit.   Past Medical History:   Asthma                                                       Thyroid disease  Comment:synthroid   HLD (hyperlipidemia)                                         Heart murmur                                                 H/O: hysterectomy                                            History of vertigo                                           LBBB (left bundle branch block)                             No past surgical history on file.  Review of patient's family history indicates:   Heart attack                   Father                   Cancer                         Mother                   Social History   Marital Status: Married             Spouse Name:                      Years of Education:                 Number of children:             Occupational History   None on file  Social History Main Topics   Smoking Status: Never Smoker                     Smokeless Status: Not on file                      Alcohol Use: No             Drug Use: No             Sexual Activity: Not on file         Other Topics            Concern   None on file  Social History Narrative   None on file    ROS: See history of present illness otherwise negative   PHYSICAL EXAM: Well-nournished, in no acute distress. Neck: No JVD, HJR, Bruit, or thyroid enlargement  Lungs: No tachypnea, clear without wheezing, rales, or rhonchi  Cardiovascular: RRR, PMI not displaced, 2/6 systolic murmur at the left sternal border, no gallops, bruit, thrill, or heave.  Abdomen: BS normal. Soft without organomegaly, masses, lesions or tenderness.  Extremities: without cyanosis,  clubbing or edema. Good distal pulses bilateral  SKin: Warm, no lesions or rashes   Musculoskeletal: No deformities  Neuro: no focal signs  BP 115/63  Pulse 81  Ht 4\' 11"  (1.499 m)  Wt 131 lb (59.421 kg)  BMI 26.44 kg/m2    EKG: Normal sinus rhythm with left bundle branch block  CTA coronary calcium score 10/05/13 IMPRESSION: 1. Coronary calcium score of 0. This was 0 percentile for age and sex matched control.   2. No evidence of CAD.   Tobias AlexanderKatarina Nelson

## 2013-11-23 NOTE — Assessment & Plan Note (Signed)
Improved. CTA coronary calcium score of 0.

## 2014-02-24 ENCOUNTER — Ambulatory Visit (INDEPENDENT_AMBULATORY_CARE_PROVIDER_SITE_OTHER): Payer: Medicare Other | Admitting: Cardiology

## 2014-02-24 ENCOUNTER — Encounter: Payer: Self-pay | Admitting: Cardiology

## 2014-02-24 VITALS — BP 122/76 | HR 91 | Ht 59.0 in | Wt 126.1 lb

## 2014-02-24 DIAGNOSIS — G4710 Hypersomnia, unspecified: Secondary | ICD-10-CM

## 2014-02-24 DIAGNOSIS — I493 Ventricular premature depolarization: Secondary | ICD-10-CM | POA: Insufficient documentation

## 2014-02-24 DIAGNOSIS — I272 Pulmonary hypertension, unspecified: Secondary | ICD-10-CM

## 2014-02-24 DIAGNOSIS — I471 Supraventricular tachycardia: Secondary | ICD-10-CM

## 2014-02-24 DIAGNOSIS — I270 Primary pulmonary hypertension: Secondary | ICD-10-CM

## 2014-02-24 NOTE — Patient Instructions (Signed)
Your physician recommends that you continue on your current medications as directed. Please refer to the Current Medication list given to you today.  Your physician has recommended that you have a sleep study. This test records several body functions during sleep, including: brain activity, eye movement, oxygen and carbon dioxide blood levels, heart rate and rhythm, breathing rate and rhythm, the flow of air through your mouth and nose, snoring, body muscle movements, and chest and belly movement.  Your physician wants you to follow-up in: 6 months with Dr Turner You will receive a reminder letter in the mail two months in advance. If you don't receive a letter, please call our office to schedule the follow-up appointment.  

## 2014-02-24 NOTE — Progress Notes (Signed)
[address] [place], [place]  [address] Phone: [phone] Fax:  [phone]  Date:  02/24/2014   ID:  Adrienne Gladeancy L Parlin, DOB 06/06/1945, MRN [id number]  PCP:  Mary SellaBrewer, Margaret L, NP  Cardiologist:  Armanda Magicraci Turner, MD    History of Present Illness: Adrienne Gonzales is a 68 y.o. female with a history of asthma, mild pulmonary HTN with PASP of 45-4850mmHg, PVC's and PAC's and SVT up to 11 beats at 191bpm on Holter monitor, hypertension and daytime sleepiness. She is now here for further evaluation. When I last saw her she was complaining of exertional fatigue and Coronary CTA showed no evidence of CAD.  She was started on Cardizem for diastolic dysfunction.  Chest CT showed no evidence of PE.  She denies any chest pain, SOB, dizziness, palpitations or syncope.  She has not had any further episodes of exertional fatigue.  Wt Readings from Last 3 Encounters:  02/24/14 126 lb 1.9 oz (57.208 kg)  11/23/13 131 lb (59.421 kg)  09/29/13 125 lb (56.7 kg)     Past Medical History  Diagnosis Date  . Asthma   . Thyroid disease     synthroid  . HLD (hyperlipidemia)   . Heart murmur   . H/O: hysterectomy   . History of vertigo   . LBBB (left bundle branch block)   . SVT (supraventricular tachycardia)   . PAC (premature atrial contraction)   . PVC (premature ventricular contraction)   . Pulmonary HTN     by echo 2015 moderate PASP 45-2250mmHg    Current Outpatient Prescriptions  Medication Sig Dispense Refill  . albuterol (PROAIR HFA) 108 (90 BASE) MCG/ACT inhaler Inhale into the lungs.    Marland Kitchen. atorvastatin (LIPITOR) 20 MG tablet Take 1 tablet (20 mg total) by mouth daily. 30 tablet 6  . budesonide-formoterol (SYMBICORT) 80-4.5 MCG/ACT inhaler Inhale 2 puffs into the lungs 2 (two) times daily.    . cetirizine (ZYRTEC) 10 MG tablet Take 10 mg by mouth daily.    . Cholecalciferol (VITAMIN D3) 2000 UNITS TABS Take by mouth daily.    Marland Kitchen. desonide (DESOWEN) 0.05 % cream Apply topically 2  (two) times daily.    Marland Kitchen. diltiazem (CARDIZEM CD) 180 MG 24 hr capsule Take 1 capsule (180 mg total) by mouth daily. 30 capsule 6  . estradiol (ESTRACE) 0.5 MG tablet Take 0.5 mg by mouth daily.    Marland Kitchen. estradiol (ESTRACE) 0.5 MG tablet Take 0.5 mg by mouth daily.    . ferrous fumarate (FERROCITE) 325 (106 FE) MG TABS tablet Take 1 tablet by mouth.    . Fluticasone-Salmeterol (ADVAIR) 500-50 MCG/DOSE AEPB Inhale 1 puff into the lungs 2 (two) times daily.    Marland Kitchen. ibuprofen (ADVIL,MOTRIN) 200 MG tablet Take 200 mg by mouth every 6 (six) hours as needed.    Marland Kitchen. levothyroxine (SYNTHROID, LEVOTHROID) 100 MCG tablet Take 100 mcg by mouth daily before breakfast.    . mometasone (NASONEX) 50 MCG/ACT nasal spray Place 2 sprays into the nose daily.    . montelukast (SINGULAIR) 10 MG tablet Take 10 mg by mouth at bedtime.    . Omega-3 Fatty Acids (FISH OIL) 1200 MG CAPS Take by mouth daily.    . pantoprazole (PROTONIX) 40 MG tablet Take 40 mg by mouth daily.    . predniSONE (STERAPRED UNI-PAK) 5 MG TABS tablet Take by mouth.    Marland Kitchen. Bioflavonoid Products (ESTER C PO) Take 500 mg by mouth daily.    .Marland Kitchen  vitamin C (ASCORBIC ACID) 500 MG tablet Take 500 mg by mouth.     No current facility-administered medications for this visit.    Allergies:    Allergies  Allergen Reactions  . Barbiturates Nausea Only  . Ciprofloxacin Nausea Only and Nausea And Vomiting    SICK ON STOMACH  . Codeine     REACTION: N/V  . Penicillins     REACTION: N/V  . Percocet [Oxycodone-Acetaminophen]     Social History:  The patient  reports that she has never smoked. She does not have any smokeless tobacco history on file. She reports that she does not drink alcohol or use illicit drugs.   Family History:  The patient's family history includes Cancer in her mother; Heart attack in her father.   ROS:  Please see the history of present illness.      All other systems reviewed and negative.   PHYSICAL EXAM: VS:  BP 122/76 mmHg  Pulse 91   Ht 4\' 11"  (1.499 m)  Wt 126 lb 1.9 oz (57.208 kg)  BMI 25.46 kg/m2 Well nourished, well developed, in no acute distress HEENT: normal Neck: no JVD Cardiac:  normal S1, S2; RRR; 1/6 SM at RUSB Lungs:  clear to auscultation bilaterally, no wheezing, rhonchi or rales Abd: soft, nontender, no hepatomegaly Ext: no edema Skin: warm and dry Neuro:  CNs 2-12 intact, no focal abnormalities noted  ASSESSMENT AND PLAN:  1. Exertional fatigue with normal nuclear stress test - this has resolved 2. Chronic LBBB - this was documented back in 2011 and by EKG in 1992 3. Nonsustained SVT up to 11 beats with PAC's and PVC's - she denies any palpitations on Cardizem and she will continue on this 4. Mild to moderate Pulmonary HTN by echo PASP 45-1750mmHg with no PE on chest CT- Sleep study was ordered to rule out OSA but she cancelled it because she does not snore.  I have explained to her how OSA can lead to pulmonary HTN and she agrees to proceed with sleep study.   5. Chronic SOB from asthma but may also be exacerbated by her diastolic dysfunction.She is now on Cardizem CD 180mg  daily.     Followup with me in 6 months  Signed, Armanda Magicraci Turner, MD [facility] 02/24/2014 11:07 AM

## 2014-02-24 NOTE — Addendum Note (Signed)
Addended by: Gunnar FusiKEMP, KATHRYN A on: 02/24/2014 12:06 PM   Modules accepted: Orders

## 2014-04-07 ENCOUNTER — Other Ambulatory Visit: Payer: Self-pay | Admitting: Cardiology

## 2014-04-08 NOTE — Telephone Encounter (Signed)
Rx refill sent to patient pharmacy   

## 2014-04-21 ENCOUNTER — Other Ambulatory Visit: Payer: Self-pay | Admitting: Cardiology

## 2014-05-13 ENCOUNTER — Ambulatory Visit (HOSPITAL_BASED_OUTPATIENT_CLINIC_OR_DEPARTMENT_OTHER): Payer: Medicare Other | Attending: Cardiology | Admitting: Radiology

## 2014-05-13 VITALS — Ht 59.0 in | Wt 128.0 lb

## 2014-05-13 DIAGNOSIS — I272 Pulmonary hypertension, unspecified: Secondary | ICD-10-CM

## 2014-05-13 DIAGNOSIS — G4710 Hypersomnia, unspecified: Secondary | ICD-10-CM

## 2014-05-13 DIAGNOSIS — I270 Primary pulmonary hypertension: Secondary | ICD-10-CM | POA: Diagnosis present

## 2014-05-25 ENCOUNTER — Telehealth: Payer: Self-pay | Admitting: Cardiology

## 2014-05-25 DIAGNOSIS — I272 Pulmonary hypertension, unspecified: Secondary | ICD-10-CM

## 2014-05-25 NOTE — Telephone Encounter (Signed)
Please set up repeat echo in May to reassess pulmonary HTN

## 2014-05-25 NOTE — Sleep Study (Signed)
   NAME: Adrienne Gonzales DATE OF BIRTH:  02/19/1946 MEDICAL RECORD NUMBER [id number]  LOCATION: [facility]  PHYSICIAN: Shuayb Schepers R  DATE OF STUDY: 05/13/2014  SLEEP STUDY TYPE: Nocturnal Polysomnogram               REFERRING PHYSICIAN: Quintella Reicherturner, Lynnox Girten R, MD  INDICATION FOR STUDY: Restless sleep and pulmonary HTN  EPWORTH SLEEPINESS SCORE: 2 HEIGHT: 4\' 11"  (149.9 cm)  WEIGHT: 128 lb (58.06 kg)    Body mass index is 25.84 kg/(m^2).  NECK SIZE: 14 in.  MEDICATIONS: Reviewed in the chart  SLEEP ARCHITECTURE: The patient slept for a total of 248 minutes with no slow wave sleep and 40 minutes of REM sleep.  The onset to sleep latency was 10 minutes and onset to REM sleep latency was 120 minutes.  The sleep efficiency was reduced at 71%.    RESPIRATORY DATA: The patient had a total of 9 apneas, of which, 8 were obstructive and 1 was a central event.  There were 9 hypopneas. Most events occurred during REM supine sleep.  There was mild to moderate snoring  OXYGEN DATA: The lowest oxygen saturation during the study was 83% during REM sleep.  The total time spent with O2 saturations <88% was 1.1 minutes.  CARDIAC DATA: The patient maintained NSR with PVC's and PAC's during the study.  MOVEMENT/PARASOMNIA: There were no periodic limb movements or REM behavior sleep disorders during the study.  IMPRESSION/ RECOMMENDATION:   1.  No significant obstructive sleep apnea/hypopnea syndrome with overall AHI in normal range at 4.4 events per hour. 2.  Mild to moderate snoring was noted. 3.  Reduced sleep efficiency with increased frequency of spontaneous arousals. 4.  Minimal O2 desaturations during sleep.   The total time spent with O2 saturations <88% was 1.1 minutes. 5.  Sleep fragmentation with no slow wave sleep and prolonged latency to REM sleep. 6.  The patient should be counseled in good sleep hygiene.   7.  No indication for CPAP therapy at this  time.  Signed: Quintella ReichertURNER,Damyen Knoll R Diplomate, American Board of Sleep Medicine  ELECTRONICALLY SIGNED ON:  05/25/2014, 10:38 AM [facility] PH: [phone]   FX: [phone] ACCREDITED BY THE AMERICAN ACADEMY OF SLEEP MEDICINE

## 2014-05-25 NOTE — Telephone Encounter (Signed)
Please let patient know that she did not have significant sleep apnea.

## 2014-05-26 NOTE — Telephone Encounter (Signed)
Patient informed of sleep study results and verbal understanding expressed.

## 2014-05-26 NOTE — Telephone Encounter (Signed)
Patient agrees to repeat ECHO in May. Ordered for scheduling.

## 2014-05-26 NOTE — Addendum Note (Signed)
Addended by: Gunnar FusiKEMP, Keiron Iodice A on: 05/26/2014 01:49 PM   Modules accepted: Orders

## 2014-05-31 ENCOUNTER — Encounter: Payer: Self-pay | Admitting: Cardiology

## 2014-08-31 ENCOUNTER — Ambulatory Visit (HOSPITAL_COMMUNITY): Payer: Medicare Other | Attending: Cardiovascular Disease

## 2014-08-31 ENCOUNTER — Other Ambulatory Visit (HOSPITAL_COMMUNITY): Payer: Medicare Other

## 2014-08-31 ENCOUNTER — Other Ambulatory Visit: Payer: Self-pay

## 2014-08-31 DIAGNOSIS — I272 Pulmonary hypertension, unspecified: Secondary | ICD-10-CM

## 2014-08-31 DIAGNOSIS — I270 Primary pulmonary hypertension: Secondary | ICD-10-CM

## 2014-09-09 ENCOUNTER — Ambulatory Visit (INDEPENDENT_AMBULATORY_CARE_PROVIDER_SITE_OTHER): Payer: Medicare Other | Admitting: Cardiology

## 2014-09-09 ENCOUNTER — Encounter: Payer: Self-pay | Admitting: Cardiology

## 2014-09-09 VITALS — BP 110/68 | HR 70 | Ht 59.0 in | Wt 126.4 lb

## 2014-09-09 DIAGNOSIS — I270 Primary pulmonary hypertension: Secondary | ICD-10-CM | POA: Diagnosis not present

## 2014-09-09 DIAGNOSIS — R0602 Shortness of breath: Secondary | ICD-10-CM

## 2014-09-09 DIAGNOSIS — I471 Supraventricular tachycardia: Secondary | ICD-10-CM | POA: Diagnosis not present

## 2014-09-09 DIAGNOSIS — I493 Ventricular premature depolarization: Secondary | ICD-10-CM

## 2014-09-09 DIAGNOSIS — I272 Pulmonary hypertension, unspecified: Secondary | ICD-10-CM

## 2014-09-09 NOTE — Patient Instructions (Signed)

## 2014-09-09 NOTE — Progress Notes (Signed)
Cardiology Office Note   Date:  09/09/2014   ID:  Adrienne Gonzales, Adrienne Gonzales 11/27/1945, MRN [id number]  PCP:  Mary Sella, NP    Chief Complaint  Patient presents with  . Follow-up    SVT      History of Present Illness: LELER BRION is a 69 y.o. female with a history of asthma, mild pulmonary HTN with PASP of 45-27mmHg most likely secondary to diastolic dysfunction, PVC's and PAC's and SVT up to 11 beats at 191bpm on Holter monitor, hypertension and daytime sleepiness. She is now here for followup. She denies any chest pain,  dizziness, palpitations or syncope. She has not had any further episodes of exertional fatigue. She has chronic DOE which is stable.      Past Medical History  Diagnosis Date  . Asthma   . Thyroid disease     synthroid  . HLD (hyperlipidemia)   . Heart murmur   . H/O: hysterectomy   . History of vertigo   . LBBB (left bundle branch block)   . SVT (supraventricular tachycardia)   . PAC (premature atrial contraction)   . PVC (premature ventricular contraction)   . Pulmonary HTN     by echo 2015 moderate PASP 45-27mmHg.  Resolved on repeat echo 2016    History reviewed. No pertinent past surgical history.   Current Outpatient Prescriptions  Medication Sig Dispense Refill  . albuterol (PROAIR HFA) 108 (90 BASE) MCG/ACT inhaler Inhale into the lungs.    Marland Kitchen amitriptyline (ELAVIL) 25 MG tablet Take 25 mg by mouth as needed. For restless leg    . atorvastatin (LIPITOR) 20 MG tablet TAKE ONE TABLET BY MOUTH DAILY 30 tablet 6  . Bioflavonoid Products (ESTER C PO) Take 500 mg by mouth daily.    . budesonide-formoterol (SYMBICORT) 80-4.5 MCG/ACT inhaler Inhale 2 puffs into the lungs 2 (two) times daily.    . cetirizine (ZYRTEC) 10 MG tablet Take 10 mg by mouth daily.    . Cholecalciferol (VITAMIN D3) 2000 UNITS TABS Take by mouth daily.    Marland Kitchen desonide (DESOWEN) 0.05 % cream Apply topically 2 (two) times daily.    Marland Kitchen diltiazem (CARDIZEM CD) 180  MG 24 hr capsule TAKE ONE CAPSULE BY MOUTH DAILY 30 capsule 10  . estradiol (ESTRACE) 0.5 MG tablet Take 0.5 mg by mouth daily.    . ferrous fumarate (FERROCITE) 325 (106 FE) MG TABS tablet Take 1 tablet by mouth.    . Fluticasone-Salmeterol (ADVAIR) 500-50 MCG/DOSE AEPB Inhale 1 puff into the lungs 2 (two) times daily.    Marland Kitchen ibuprofen (ADVIL,MOTRIN) 200 MG tablet Take 200 mg by mouth every 6 (six) hours as needed.    Marland Kitchen levothyroxine (SYNTHROID, LEVOTHROID) 100 MCG tablet Take 100 mcg by mouth daily before breakfast.    . mometasone (NASONEX) 50 MCG/ACT nasal spray Place 2 sprays into the nose daily.    . montelukast (SINGULAIR) 10 MG tablet Take 10 mg by mouth at bedtime.    . Omega-3 Fatty Acids (FISH OIL) 1200 MG CAPS Take by mouth daily.    . pantoprazole (PROTONIX) 40 MG tablet Take 40 mg by mouth daily.    . predniSONE (STERAPRED UNI-PAK) 5 MG TABS tablet Take by mouth.    . vitamin C (ASCORBIC ACID) 500 MG tablet Take 500 mg by mouth.     No current facility-administered medications for this visit.    Allergies:   Barbiturates; Ciprofloxacin; Codeine; Penicillins; and Percocet  Social History:  The patient  reports that she has never smoked. She does not have any smokeless tobacco history on file. She reports that she does not drink alcohol or use illicit drugs.   Family History:  The patient's family history includes Cancer in her mother; Heart attack in her father.    ROS:  Please see the history of present illness.   Otherwise, review of systems are positive for none.   All other systems are reviewed and negative.    PHYSICAL EXAM: VS:  BP 110/68 mmHg  Pulse 70  Ht 4\' 11"  (1.499 m)  Wt 126 lb 6.4 oz (57.335 kg)  BMI 25.52 kg/m2  SpO2 96% , BMI Body mass index is 25.52 kg/(m^2). GEN: Well nourished, well developed, in no acute distress HEENT: normal Neck: no JVD, carotid bruits, or masses Cardiac: RRR; no rubs, or gallops,no edema.  2/6 SM at RUSB  Respiratory:  clear  to auscultation bilaterally, normal work of breathing GI: soft, nontender, nondistended, + BS MS: no deformity or atrophy Skin: warm and dry, no rash Neuro:  Strength and sensation are intact Psych: euthymic mood, full affect   EKG:  EKG is not ordered today.    Recent Labs: 09/11/2013: BUN 13; Creatinine 0.6; Potassium 3.6; Pro B Natriuretic peptide (BNP) 15.0; Sodium 138 11/02/2013: ALT 14    Lipid Panel    Component Value Date/Time   CHOL 145 11/02/2013 0950   TRIG 58.0 11/02/2013 0950   HDL 63.00 11/02/2013 0950   CHOLHDL 2 11/02/2013 0950   VLDL 11.6 11/02/2013 0950   LDLCALC 70 11/02/2013 0950      Wt Readings from Last 3 Encounters:  09/09/14 126 lb 6.4 oz (57.335 kg)  05/13/14 128 lb (58.06 kg)  02/24/14 126 lb 1.9 oz (57.208 kg)      ASSESSMENT AND PLAN:  1. Exertional fatigue with normal nuclear stress test - this has resolved 2. Chronic LBBB - this was documented back in 2011 and by EKG in 1992 3. Nonsustained SVT up to 11 beats with PAC's and PVC's - she denies any palpitations on Cardizem and she will continue on this 4. Mild to moderate Pulmonary HTN by echo PASP 45-150mmHg with no PE on chest CT- no sleep apnea on PSG in 2015 but last echo 08/2014 shows resolution of pulmonary HTN 5. Chronic SOB from asthma and chronic diastolic CHF.  This is stable.  She is now on Cardizem CD 180mg  daily.  Current medicines are reviewed at length with the patient today.  The patient does not have concerns regarding medicines.  The following changes have been made:  no change  Labs/ tests ordered today include: see above assessment and plan No orders of the defined types were placed in this encounter.     Disposition:   FU with me in 1 year   Signed, Quintella ReichertURNER,Tishana Clinkenbeard R, MD  09/09/2014 11:36 AM    [facility] [address], [place], [place]  [address] Phone: [phone](336) [phone]; Fax: [phone]) [phone]

## 2015-01-08 ENCOUNTER — Other Ambulatory Visit: Payer: Self-pay | Admitting: Cardiology

## 2015-03-07 NOTE — Progress Notes (Signed)
Cardiology Office Note   Date:  03/08/2015   ID:  Adrienne Gonzales, DOB 1945-08-01, MRN [id number]  PCP:  Marcell Anger, NP    Chief Complaint  Patient presents with  . PVC  . svt  . Shortness of Breath      History of Present Illness: Adrienne Gonzales is a 69y.o. female with a history of asthma, moderate pulmonary HTN with PASP of 45-22mmHg in the past but echo 08/2014 with resolution of pulmonary HTN (this was most likely secondary to diastolic dysfunction), PVC's and PAC's and SVT up to 11 beats at 191bpm on Holter monitor, hypertension and daytime sleepiness but no OSA on sleep study. She is now here for followup. She denies any chest pain, dizziness, palpitations or syncope. She has not had any further episodes of exertional fatigue. She has chronic DOE which is stable. She has has some sinus congestion recently but that is better.      Past Medical History  Diagnosis Date  . Asthma   . Thyroid disease     synthroid  . HLD (hyperlipidemia)   . Heart murmur   . H/O: hysterectomy   . History of vertigo   . LBBB (left bundle branch block)   . SVT (supraventricular tachycardia) (HCC)   . PAC (premature atrial contraction)   . PVC (premature ventricular contraction)   . Pulmonary HTN (HCC)     by echo 2015 moderate PASP 45-61mmHg.  Resolved on repeat echo 2016    No past surgical history on file.   Current Outpatient Prescriptions  Medication Sig Dispense Refill  . atorvastatin (LIPITOR) 20 MG tablet Take 20 mg by mouth daily.  0  . Bioflavonoid Products (ESTER C PO) Take 500 mg by mouth daily.    . budesonide-formoterol (SYMBICORT) 80-4.5 MCG/ACT inhaler Inhale 2 puffs into the lungs 2 (two) times daily.    . cetirizine (ZYRTEC) 10 MG tablet Take 10 mg by mouth daily.    Marland Kitchen diltiazem (CARDIZEM CD) 180 MG 24 hr capsule TAKE ONE CAPSULE BY MOUTH DAILY 30 capsule 10  . estradiol (ESTRACE) 0.5 MG tablet Take 0.5 mg by mouth daily.    .  Fluticasone-Salmeterol (ADVAIR) 500-50 MCG/DOSE AEPB Inhale 1 puff into the lungs 2 (two) times daily.    Marland Kitchen levothyroxine (SYNTHROID, LEVOTHROID) 100 MCG tablet Take 100 mcg by mouth daily before breakfast.    . mometasone (NASONEX) 50 MCG/ACT nasal spray Place 2 sprays into the nose daily.    . montelukast (SINGULAIR) 10 MG tablet Take 10 mg by mouth at bedtime.    . pantoprazole (PROTONIX) 40 MG tablet Take 40 mg by mouth daily.    . vitamin C (ASCORBIC ACID) 500 MG tablet Take 500 mg by mouth.     No current facility-administered medications for this visit.    Allergies:   Barbiturates; Ciprofloxacin; Codeine; Penicillins; and Percocet    Social History:  The patient  reports that she has never smoked. She does not have any smokeless tobacco history on file. She reports that she does not drink alcohol or use illicit drugs.   Family History:  The patient's family history includes Cancer in her mother; Heart attack in her father.    ROS:  Please see the history of present illness.   Otherwise, review of systems are positive for none.   All other systems are reviewed and negative.  PHYSICAL EXAM: VS:  BP 112/68 mmHg  Pulse 82  Ht 4\' 11"  (1.499 m)  Wt 57.97 kg (127 lb 12.8 oz)  BMI 25.80 kg/m2 , BMI Body mass index is 25.8 kg/(m^2). GEN: Well nourished, well developed, in no acute distress HEENT: normal Neck: no JVD, carotid bruits, or masses Cardiac: RRR; no rubs, or gallops,no edema.  2/6 SM at LLSB Respiratory:  clear to auscultation bilaterally, normal work of breathing GI: soft, nontender, nondistended, + BS MS: no deformity or atrophy Skin: warm and dry, no rash Neuro:  Strength and sensation are intact Psych: euthymic mood, full affect   EKG:  EKG was ordered today and showed NSR with PVCs and LBBB    Recent Labs: No results found for requested labs within last 365 days.    Lipid Panel    Component Value Date/Time   CHOL 145 11/02/2013 0950   TRIG 58.0  11/02/2013 0950   HDL 63.00 11/02/2013 0950   CHOLHDL 2 11/02/2013 0950   VLDL 11.6 11/02/2013 0950   LDLCALC 70 11/02/2013 0950      Wt Readings from Last 3 Encounters:  03/08/15 57.97 kg (127 lb 12.8 oz)  09/09/14 57.335 kg (126 lb 6.4 oz)  05/13/14 58.06 kg (128 lb)    ASSESSMENT AND PLAN:  1. Exertional fatigue - Coronary CT calcium score 0 and no CAD 09/2013-resolved 2. Chronic LBBB - this was documented back in 2011 and by EKG in 1992 3. Nonsustained SVT up to 11 beats with PAC's and PVC's - she denies any palpitations on Cardizem and she will continue on this 4. Moderate Pulmonary HTN by echo PASP 45-3350mmHg with no PE on chest CT- no sleep apnea on PSG in 2015 but last echo 08/2014 shows resolution of pulmonary HTN 5. Chronic SOB from asthma and chronic diastolic CHF. This is stable. She is now on Cardizem CD 180mg  daily.    Current medicines are reviewed at length with the patient today.  The patient does not have concerns regarding medicines.  The following changes have been made:  no change  Labs/ tests ordered today: See above Assessment and Plan No orders of the defined types were placed in this encounter.     Disposition:   FU with me in 1 year  Signed, Quintella ReichertURNER,Angad Nabers R, MD  03/08/2015 9:34 AM    [facility] [address], [place], [place]  [address] Phone: [phone]([phone]; Fax: [phone]

## 2015-03-08 ENCOUNTER — Ambulatory Visit (INDEPENDENT_AMBULATORY_CARE_PROVIDER_SITE_OTHER): Payer: Medicare Other | Admitting: Cardiology

## 2015-03-08 ENCOUNTER — Encounter: Payer: Self-pay | Admitting: Cardiology

## 2015-03-08 VITALS — BP 112/68 | HR 82 | Ht 59.0 in | Wt 127.8 lb

## 2015-03-08 DIAGNOSIS — R0602 Shortness of breath: Secondary | ICD-10-CM | POA: Diagnosis not present

## 2015-03-08 DIAGNOSIS — I471 Supraventricular tachycardia: Secondary | ICD-10-CM | POA: Diagnosis not present

## 2015-03-08 DIAGNOSIS — I272 Other secondary pulmonary hypertension: Secondary | ICD-10-CM | POA: Diagnosis not present

## 2015-03-08 DIAGNOSIS — I493 Ventricular premature depolarization: Secondary | ICD-10-CM | POA: Diagnosis not present

## 2015-03-08 MED ORDER — ATORVASTATIN CALCIUM 20 MG PO TABS: 20.0000 mg | ORAL_TABLET | Freq: Every day | ORAL | Status: DC

## 2015-03-08 MED ORDER — DILTIAZEM HCL ER COATED BEADS 180 MG PO CP24
180.0000 mg | ORAL_CAPSULE | Freq: Every day | ORAL | Status: DC
Start: 2015-03-08 — End: 2016-03-15

## 2015-03-08 NOTE — Patient Instructions (Signed)

## 2015-05-16 IMAGING — CT CT HEART MORP W/ CTA COR W/ SCORE W/ CA W/CM &/OR W/O CM
1 of 10 series · 1 of 20 positions shown, 2 images · IV contrast (Iodine)
Comparison: Chest CT 09/15/2013.

CLINICAL DATA: Fatigue and non-conclusive stress test

EXAM:
Cardiac/Coronary  CT
TECHNIQUE: The patient was scanned on a Philips 256 scanner.

[Series 300: locator · axial · 0.35mm/px · z∈[-142,-142]mm · 1 of 1 slices shown, 2 images]
[im 1/1  vessel]
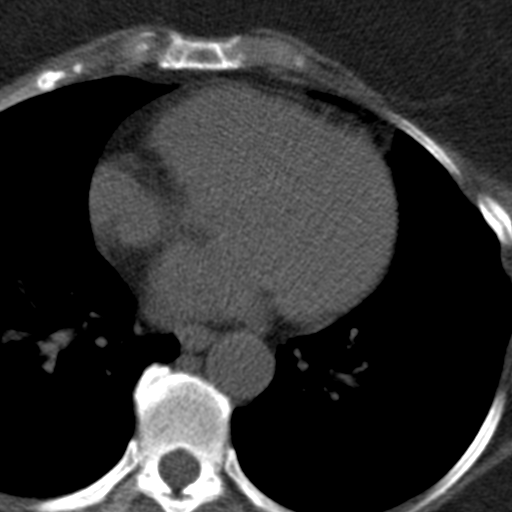
[im 1/1  lung]
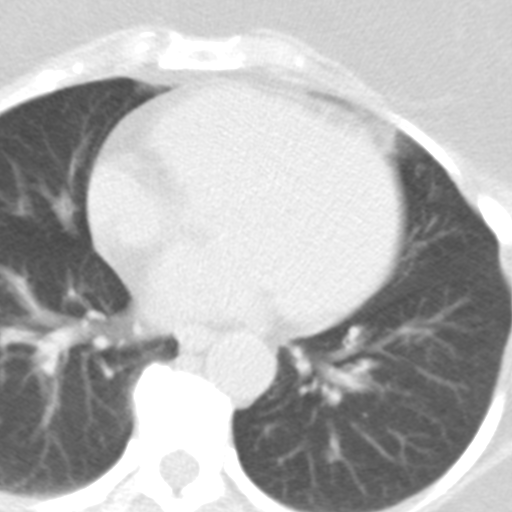

[1 of 20 positions shown; findings below may reference images not displayed]

FINDINGS: A 120 kV prospective scan was triggered in the descending thoracic
aorta at 111 HU's. Axial non-contrast 3 mm slices were carried out
through the heart. The data set was analyzed on a dedicated work
station and scored using the Agatson method. Gantry rotation speed
was 270 msecs and collimation was .9 mm. 2.5 mg iv Metoprolol and
0.4 mg of sl NTG was given. The 3D data set was reconstructed in 5%
intervals of the 67-82 % of the R-R cycle. Diastolic phases were
analyzed on a dedicated work station using MPR, MIP and VRT modes.
The patient received 80 cc of contrast.

Aorta:  No dilatation, no calcification.

Aortic Valve:  Trileaflet, no calcifications.

Coronary Arteries: Originating in a regular position. Right
dominance.

LM - no plague

LAD is a large and long vessel that wraps around the apex and fives
rise to a small diagonal branch. No plague in LAD territory.

LCX is a medium size non-dominant vessel that gives rise to two
obtuse marginal branches. No plague is seen.

RCA is a large dominant vessel that gives rise to PDA and PLVB. No
atherosclerotic plague is seen.
IMPRESSION: 1. Coronary calcium score of 0. This was 0 percentile for age and
sex matched control.

2. No evidence of CAD.

[REDACTED]AL DATA:  The following report is an over-read performed by
10/13/2013. This over-read does not include interpretation of cardiac
or coronary anatomy or pathology. The CTA interpretation by the
cardiologist is attached.
FINDINGS: The visualized mediastinum appears normal without adenopathy. There
is no pleural or pericardial effusion. Previously opacified
left-sided SVC demonstrates limited contrast enhancement on the
current examination.

There is stable mild scarring medially in the right lower lobe. The
lungs are otherwise clear.

Images through the upper abdomen demonstrate a small hiatal hernia
and a 3.5 cm cyst in the upper pole of the left kidney.
IMPRESSION: No acute or significant extra cardiac thoracic findings. Limited
opacification of previously demonstrated persistent left-sided SVC.

## 2015-12-15 ENCOUNTER — Other Ambulatory Visit: Payer: Self-pay | Admitting: Cardiology

## 2015-12-20 ENCOUNTER — Other Ambulatory Visit: Payer: Self-pay | Admitting: Cardiology

## 2015-12-20 ENCOUNTER — Other Ambulatory Visit: Payer: Self-pay

## 2016-03-15 ENCOUNTER — Other Ambulatory Visit: Payer: Self-pay | Admitting: Cardiology

## 2016-03-15 ENCOUNTER — Ambulatory Visit: Payer: Medicare Other | Admitting: Cardiology

## 2016-03-15 MED ORDER — DILTIAZEM HCL ER COATED BEADS 180 MG PO CP24: 180.0000 mg | ORAL_CAPSULE | Freq: Every day | ORAL | 0 refills | Status: DC

## 2016-04-03 ENCOUNTER — Encounter: Payer: Self-pay | Admitting: Cardiology

## 2016-04-03 ENCOUNTER — Encounter (INDEPENDENT_AMBULATORY_CARE_PROVIDER_SITE_OTHER): Payer: Self-pay

## 2016-04-03 ENCOUNTER — Ambulatory Visit (INDEPENDENT_AMBULATORY_CARE_PROVIDER_SITE_OTHER): Payer: Medicare Other | Admitting: Cardiology

## 2016-04-03 VITALS — BP 122/60 | HR 83 | Ht 59.0 in | Wt 128.8 lb

## 2016-04-03 DIAGNOSIS — I447 Left bundle-branch block, unspecified: Secondary | ICD-10-CM | POA: Diagnosis not present

## 2016-04-03 DIAGNOSIS — I471 Supraventricular tachycardia, unspecified: Secondary | ICD-10-CM

## 2016-04-03 DIAGNOSIS — I493 Ventricular premature depolarization: Secondary | ICD-10-CM

## 2016-04-03 NOTE — Patient Instructions (Signed)

## 2016-04-03 NOTE — Progress Notes (Signed)
Cardiology Office Note    Date:  04/03/2016   ID:  Adrienne Gladeancy L Lentz, DOB 07/29/1945, MRN [id number]  PCP:  Marcell Angerandice P Clark, NP  Cardiologist:  Armanda Magicraci Turner, MD   Chief Complaint  Patient presents with  . Follow-up    SVT, PVCs    History of Present Illness:  Adrienne Gonzales is a 70 y.o. female with a history of asthma, moderate pulmonary HTN with PASP of 45-350mmHg in the past but echo 08/2014 showed resolution of pulmonary HTN (this was most likely secondary to diastolic dysfunction), PVC's, chronic LBBB and PAC's and SVT up to 11 beats at 191bpm on Holter monitor, hypertension and daytime sleepiness but no OSA on sleep study. She is now here for followup. She denies any chest pain,SOB, DOE, dizziness, palpitations or syncope.   Past Medical History:  Diagnosis Date  . Asthma   . H/O: hysterectomy   . Heart murmur   . History of vertigo   . HLD (hyperlipidemia)   . LBBB (left bundle branch block)   . PAC (premature atrial contraction)   . Pulmonary HTN    by echo 2015 moderate PASP 45-2950mmHg.  Resolved on repeat echo 2016  . PVC (premature ventricular contraction)   . SVT (supraventricular tachycardia) (HCC)   . Thyroid disease    synthroid    History reviewed. No pertinent surgical history.  Current Medications: Outpatient Medications Prior to Visit  Medication Sig Dispense Refill  . albuterol (PROVENTIL HFA;VENTOLIN HFA) 108 (90 BASE) MCG/ACT inhaler Inhale 1-2 puffs into the lungs every 6 (six) hours as needed for wheezing or shortness of breath.    Marland Kitchen. amitriptyline (ELAVIL) 25 MG tablet Take 25 mg by mouth daily as needed (for restless leg).    Marland Kitchen. atorvastatin (LIPITOR) 20 MG tablet TAKE ONE TABLET EVERY DAY 90 tablet 0  . budesonide-formoterol (SYMBICORT) 80-4.5 MCG/ACT inhaler Inhale 2 puffs into the lungs 2 (two) times daily.    . cetirizine (ZYRTEC) 10 MG tablet Take 10 mg by mouth daily.    . Cholecalciferol (VITAMIN D3) 2000 UNITS TABS Take 1 tablet by  mouth daily.    Marland Kitchen. desonide (DESOWEN) 0.05 % cream Apply 1 application topically 2 (two) times daily.    Marland Kitchen. diltiazem (CARDIZEM CD) 180 MG 24 hr capsule Take 1 capsule (180 mg total) by mouth daily. 90 capsule 0  . estradiol (ESTRACE) 0.5 MG tablet Take 0.5 mg by mouth daily.    . ferrous fumarate (HEMOCYTE - 106 MG FE) 325 (106 FE) MG TABS tablet Take 1 tablet by mouth daily.    . Fluticasone-Salmeterol (ADVAIR) 500-50 MCG/DOSE AEPB Inhale 1 puff into the lungs 2 (two) times daily.    Marland Kitchen. ibuprofen (ADVIL,MOTRIN) 200 MG tablet Take 200 mg by mouth every 6 (six) hours as needed for fever, headache, mild pain, moderate pain or cramping.    Marland Kitchen. levothyroxine (SYNTHROID, LEVOTHROID) 100 MCG tablet Take 100 mcg by mouth daily before breakfast.    . mometasone (NASONEX) 50 MCG/ACT nasal spray Place 2 sprays into the nose daily.    . montelukast (SINGULAIR) 10 MG tablet Take 10 mg by mouth at bedtime.    . pantoprazole (PROTONIX) 40 MG tablet Take 40 mg by mouth daily.    . vitamin C (ASCORBIC ACID) 500 MG tablet Take 500 mg by mouth.    Marland Kitchen. ascorbic acid (VITAMIN C) 500 MG tablet Take 500 mg by mouth daily.    Marland Kitchen. atorvastatin (LIPITOR) 20 MG tablet Take 20  mg by mouth daily.  0  . Bioflavonoid Products (ESTER C PO) Take 500 mg by mouth daily.     No facility-administered medications prior to visit.      Allergies:   Barbiturates; Ciprofloxacin; Codeine; Penicillins; and Percocet [oxycodone-acetaminophen]   Social History   Social History  . Marital status: Married    Spouse name: N/A  . Number of children: N/A  . Years of education: N/A   Social History Main Topics  . Smoking status: Never Smoker  . Smokeless tobacco: Never Used  . Alcohol use No  . Drug use: No  . Sexual activity: Not Asked   Other Topics Concern  . None   Social History Narrative  . None     Family History:  The patient's family history includes Cancer in her mother; Heart attack in her father.   ROS:   Please see  the history of present illness.    ROS All other systems reviewed and are negative.  No flowsheet data found.     PHYSICAL EXAM:   VS:  BP 122/60   Pulse 83   Ht 4\' 11"  (1.499 m)   Wt 128 lb 12.8 oz (58.4 kg)   SpO2 97%   BMI 26.01 kg/m    GEN: Well nourished, well developed, in no acute distress  HEENT: normal  Neck: no JVD, carotid bruits, or masses Cardiac: RRR; no rubs, or gallops,no edema.  Intact distal pulses bilaterally. 2/6 SM at RUSB Respiratory:  clear to auscultation bilaterally, normal work of breathing GI: soft, nontender, nondistended, + BS MS: no deformity or atrophy  Skin: warm and dry, no rash Neuro:  Alert and Oriented x 3, Strength and sensation are intact Psych: euthymic mood, full affect  Wt Readings from Last 3 Encounters:  04/03/16 128 lb 12.8 oz (58.4 kg)  03/08/15 127 lb 12.8 oz (58 kg)  09/09/14 126 lb 6.4 oz (57.3 kg)      Studies/Labs Reviewed:   EKG:  EKG is ordered today.  The ekg ordered today demonstrates NSR with LBBB  Recent Labs: No results found for requested labs within last 8760 hours.   Lipid Panel    Component Value Date/Time   CHOL 145 11/02/2013 0950   TRIG 58.0 11/02/2013 0950   HDL 63.00 11/02/2013 0950   CHOLHDL 2 11/02/2013 0950   VLDL 11.6 11/02/2013 0950   LDLCALC 70 11/02/2013 0950    Additional studies/ records that were reviewed today include:  none    ASSESSMENT:    1. SVT (supraventricular tachycardia) (HCC)   2. PVC (premature ventricular contraction)   3. LBBB (left bundle branch block)      PLAN:  In order of problems listed above:  1. SVT - she has not had any episodes since I saw her last.  Continue Cardizem. 2. PVC's - suppressed on CCB 3. LBBB - unchanged on EKG today.  No evidence of CAD on coronary CTA,    Medication Adjustments/Labs and Tests Ordered: Current medicines are reviewed at length with the patient today.  Concerns regarding medicines are outlined above.  Medication  changes, Labs and Tests ordered today are listed in the Patient Instructions below.  There are no Patient Instructions on file for this visit.   Signed, Armanda Magicraci Turner, MD  04/03/2016 11:27 AM    [facility] [address], [place], [place]  [address] Phone: [phone]; Fax: [phone]

## 2016-06-14 ENCOUNTER — Other Ambulatory Visit: Payer: Self-pay | Admitting: Cardiology

## 2017-03-04 ENCOUNTER — Other Ambulatory Visit: Payer: Self-pay | Admitting: Cardiology

## 2017-04-29 NOTE — Progress Notes (Signed)
Cardiology Office Note:    Date:  04/30/2017   ID:  Wilder Glade, DOB May 11, 1945, MRN [id number]  PCP:  Marcell Anger, NP  Cardiologist:  No primary care provider on file.    Referring MD: Marcell Anger, NP   Chief Complaint  Patient presents with  . Follow-up    LBBB, PVCs, SVT, HTN    History of Present Illness:    Adrienne Gonzales is a 72 y.o. female with a hx of asthma, moderate pulmonary HTN with PASP of 45-66mmHg in the past but echo 08/2014 showed resolution of pulmonary HTN (this was most likely secondary to diastolic dysfunction), PVC's, chronic LBBB and PAC's and SVT up to 11 beats at 191bpm on Holter monitor, hypertension and daytime sleepiness but no OSA on sleep study  She is here today for followup and is doing well.  She denies any chest pain or pressure, SOB, DOE, PND, orthopnea, LE edema, dizziness, palpitations or syncope. She is compliant with her meds and is tolerating meds with no SE.    Past Medical History:  Diagnosis Date  . Asthma   . H/O: hysterectomy   . Heart murmur   . History of vertigo   . HLD (hyperlipidemia)   . LBBB (left bundle branch block)   . PAC (premature atrial contraction)   . Pulmonary HTN (HCC)    by echo 2015 moderate PASP 45-37mmHg.  Resolved on repeat echo 2016  . PVC (premature ventricular contraction)   . SVT (supraventricular tachycardia) (HCC)   . Thyroid disease    synthroid    No past surgical history on file.  Current Medications: Current Meds  Medication Sig  . albuterol (PROVENTIL HFA;VENTOLIN HFA) 108 (90 BASE) MCG/ACT inhaler Inhale 1-2 puffs into the lungs every 6 (six) hours as needed for wheezing or shortness of breath.  Marland Kitchen atorvastatin (LIPITOR) 20 MG tablet TAKE ONE TABLET EVERY DAY  . budesonide-formoterol (SYMBICORT) 80-4.5 MCG/ACT inhaler Inhale 2 puffs into the lungs 2 (two) times daily.  . cetirizine (ZYRTEC) 10 MG tablet Take 10 mg by mouth daily.  Marland Kitchen desonide (DESOWEN) 0.05 % cream Apply 1  application topically 2 (two) times daily.  Marland Kitchen diltiazem (CARDIZEM CD) 180 MG 24 hr capsule TAKE ONE CAPSULE BY MOUTH DAILY  . ferrous fumarate (HEMOCYTE - 106 MG FE) 325 (106 FE) MG TABS tablet Take 1 tablet by mouth daily.  . Fluticasone-Salmeterol (ADVAIR) 500-50 MCG/DOSE AEPB Inhale 1 puff into the lungs 2 (two) times daily.  Marland Kitchen ibuprofen (ADVIL,MOTRIN) 200 MG tablet Take 200 mg by mouth every 6 (six) hours as needed for fever, headache, mild pain, moderate pain or cramping.  Marland Kitchen levothyroxine (SYNTHROID, LEVOTHROID) 100 MCG tablet Take 100 mcg by mouth daily before breakfast.  . mometasone (NASONEX) 50 MCG/ACT nasal spray Place 2 sprays into the nose daily.  . montelukast (SINGULAIR) 10 MG tablet Take 10 mg by mouth at bedtime.  . pantoprazole (PROTONIX) 40 MG tablet Take 40 mg by mouth daily.     Allergies:   Sulfa antibiotics; Percocet [oxycodone-acetaminophen]; Barbiturates; Ciprofloxacin; Codeine; Penicillins; and Propoxyphene   Social History   Socioeconomic History  . Marital status: Married    Spouse name: None  . Number of children: None  . Years of education: None  . Highest education level: None  Social Needs  . Financial resource strain: None  . Food insecurity - worry: None  . Food insecurity - inability: None  . Transportation needs - medical: None  .  Transportation needs - non-medical: None  Occupational History  . None  Tobacco Use  . Smoking status: Never Smoker  . Smokeless tobacco: Never Used  Substance and Sexual Activity  . Alcohol use: No  . Drug use: No  . Sexual activity: None  Other Topics Concern  . None  Social History Narrative  . None     Family History: The patient's family history includes Cancer in her mother; Heart attack in her father.  ROS:   Please see the history of present illness.    ROS  All other systems reviewed and negative.   EKGs/Labs/Other Studies Reviewed:    The following studies were reviewed today: none  EKG:   EKG is  ordered today.  The ekg ordered today demonstrates NSR at 65bpm with LBBB  Recent Labs: No results found for requested labs within last 8760 hours.   Recent Lipid Panel    Component Value Date/Time   CHOL 145 11/02/2013 0950   TRIG 58.0 11/02/2013 0950   HDL 63.00 11/02/2013 0950   CHOLHDL 2 11/02/2013 0950   VLDL 11.6 11/02/2013 0950   LDLCALC 70 11/02/2013 0950    Physical Exam:    VS:  BP 136/74   Pulse 65   Ht 4\' 11"  (1.499 m)   Wt 132 lb (59.9 kg)   BMI 26.66 kg/m     Wt Readings from Last 3 Encounters:  04/30/17 132 lb (59.9 kg)  04/03/16 128 lb 12.8 oz (58.4 kg)  03/08/15 127 lb 12.8 oz (58 kg)     GEN:  Well nourished, well developed in no acute distress HEENT: Normal NECK: No JVD; No carotid bruits LYMPHATICS: No lymphadenopathy CARDIAC: RRR, no murmurs, rubs, gallops RESPIRATORY:  Clear to auscultation without rales, wheezing or rhonchi  ABDOMEN: Soft, non-tender, non-distended MUSCULOSKELETAL:  No edema; No deformity  SKIN: Warm and dry NEUROLOGIC:  Alert and oriented x 3 PSYCHIATRIC:  Normal affect   ASSESSMENT:    1. SVT (supraventricular tachycardia) (HCC)   2. Pulmonary HTN (HCC)   3. PVC (premature ventricular contraction)   4. LBBB (left bundle branch block)    PLAN:    In order of problems listed above:  1.  SVT - she has not had any reoccurrence of her SVT.  She will continue on Cardizem CD 180mg  daily.    2.  Pulmonary HTN - resolved by echo 2016.   3.  PVCs - she is asymptomatic and PVCs suppressed on Cardizem.   4.  LBBB - coronary artery score 0 and no CAD on coronary CTA.  Medication Adjustments/Labs and Tests Ordered: Current medicines are reviewed at length with the patient today.  Concerns regarding medicines are outlined above.  No orders of the defined types were placed in this encounter.  No orders of the defined types were placed in this encounter.   Signed, Armanda Magicraci Turner, MD  04/30/2017 9:21 AM    [facility]

## 2017-04-30 ENCOUNTER — Encounter: Payer: Self-pay | Admitting: Cardiology

## 2017-04-30 ENCOUNTER — Ambulatory Visit: Payer: Medicare Other | Admitting: Cardiology

## 2017-04-30 VITALS — BP 136/74 | HR 65 | Ht 59.0 in | Wt 132.0 lb

## 2017-04-30 DIAGNOSIS — I272 Pulmonary hypertension, unspecified: Secondary | ICD-10-CM | POA: Diagnosis not present

## 2017-04-30 DIAGNOSIS — I447 Left bundle-branch block, unspecified: Secondary | ICD-10-CM

## 2017-04-30 DIAGNOSIS — I471 Supraventricular tachycardia: Secondary | ICD-10-CM

## 2017-04-30 DIAGNOSIS — I493 Ventricular premature depolarization: Secondary | ICD-10-CM | POA: Diagnosis not present

## 2017-04-30 NOTE — Patient Instructions (Signed)

## 2017-06-11 ENCOUNTER — Other Ambulatory Visit: Payer: Self-pay | Admitting: Cardiology

## 2018-02-12 ENCOUNTER — Telehealth: Payer: Self-pay | Admitting: Cardiology

## 2018-02-12 NOTE — Telephone Encounter (Signed)
New Message          Pt c/o medication issue:  1. Name of Medication: Atorvastatin 20 mg  2. How are you currently taking this medication (dosage and times per day)? 1 daily  3. Are you having a reaction (difficulty breathing--STAT)? Cramps in legs  4. What is your medication issue? Cramps in legs, patient wants to try something different

## 2018-02-13 NOTE — Telephone Encounter (Signed)
Spoke with the patient and she accepted to contact PCP about new statin.

## 2018-02-13 NOTE — Telephone Encounter (Signed)
Patient is requesting a different statin due to pain in legs.

## 2018-02-13 NOTE — Telephone Encounter (Signed)
Please have her address this with her PCP.  I have not been following her lipids in 3 years

## 2018-04-10 ENCOUNTER — Encounter: Payer: Self-pay | Admitting: Cardiology

## 2018-05-08 ENCOUNTER — Ambulatory Visit: Payer: Medicare Other | Admitting: Cardiology

## 2018-05-08 ENCOUNTER — Encounter: Payer: Self-pay | Admitting: Cardiology

## 2018-05-08 VITALS — BP 118/68 | HR 68 | Ht 59.0 in | Wt 126.6 lb

## 2018-05-08 DIAGNOSIS — I493 Ventricular premature depolarization: Secondary | ICD-10-CM

## 2018-05-08 DIAGNOSIS — R252 Cramp and spasm: Secondary | ICD-10-CM

## 2018-05-08 DIAGNOSIS — I272 Pulmonary hypertension, unspecified: Secondary | ICD-10-CM | POA: Diagnosis not present

## 2018-05-08 DIAGNOSIS — R011 Cardiac murmur, unspecified: Secondary | ICD-10-CM

## 2018-05-08 DIAGNOSIS — I471 Supraventricular tachycardia: Secondary | ICD-10-CM

## 2018-05-08 DIAGNOSIS — I447 Left bundle-branch block, unspecified: Secondary | ICD-10-CM | POA: Diagnosis not present

## 2018-05-08 NOTE — Progress Notes (Signed)
Cardiology Office Note:    Date:  05/08/2018   ID:  Adrienne Gladeancy L Laye, DOB 03/03/1946, MRN [id number]  PCP:  Marcell Angerlark, Candice P, NP  Cardiologist:  No primary care provider on file.    Referring MD: Marcell Angerlark, Candice P, NP   Chief Complaint  Patient presents with  . Follow-up    Pulmonarhy HTN, PVCs, LBBB    History of Present Illness:    Adrienne Gonzales is a 73 y.o. female with a hx of asthma, moderate pulmonary HTN with PASP of 45-4850mmHg in the past but echo 5/2016showedresolution of pulmonary HTN (this was most likely secondary to diastolic dysfunction), PVC's, chronic LBBBand PAC's and SVT up to 11 beats at 191bpm on Holter monitor, hypertension and daytime sleepiness but no OSA on sleep study.  She is here today for followup and is doing well.  She denies any chest pain or pressure, SOB, DOE, PND, orthopnea, LE edema, dizziness, palpitations or syncope.  She has been complaining of leg cramping recently.  She thought it was her cholesterol medicine and she stopped her Lipitor but hurts symptoms did not resolve.  She says that she gets cramping behind her knee calf but it occurs when she walks and when she is at rest.  She recently went off her statin drug poorly on it.  She is compliant with her meds and is tolerating meds with no SE.    Past Medical History:  Diagnosis Date  . Asthma   . H/O: hysterectomy   . Heart murmur   . History of vertigo   . HLD (hyperlipidemia)   . LBBB (left bundle branch block)   . PAC (premature atrial contraction)   . Pulmonary HTN (HCC)    by echo 2015 moderate PASP 45-5550mmHg.  Resolved on repeat echo 2016  . PVC (premature ventricular contraction)   . SVT (supraventricular tachycardia) (HCC)   . Thyroid disease    synthroid    History reviewed. No pertinent surgical history.  Current Medications: Current Meds  Medication Sig  . albuterol (PROVENTIL HFA;VENTOLIN HFA) 108 (90 BASE) MCG/ACT inhaler Inhale 1-2 puffs into the lungs every 6  (six) hours as needed for wheezing or shortness of breath.  . budesonide-formoterol (SYMBICORT) 80-4.5 MCG/ACT inhaler Inhale 2 puffs into the lungs 2 (two) times daily.  . cetirizine (ZYRTEC) 10 MG tablet Take 10 mg by mouth daily.  . Coenzyme Q10 (CO Q-10 PO) Take 1 tablet by mouth daily.  Marland Kitchen. desonide (DESOWEN) 0.05 % cream Apply 1 application topically 2 (two) times daily.  Marland Kitchen. diltiazem (CARDIZEM CD) 180 MG 24 hr capsule TAKE ONE CAPSULE BY MOUTH DAILY  . ferrous fumarate (HEMOCYTE - 106 MG FE) 325 (106 FE) MG TABS tablet Take 1 tablet by mouth daily.  . Fluticasone-Salmeterol (ADVAIR) 500-50 MCG/DOSE AEPB Inhale 1 puff into the lungs 2 (two) times daily.  Marland Kitchen. ibuprofen (ADVIL,MOTRIN) 200 MG tablet Take 200 mg by mouth every 6 (six) hours as needed for fever, headache, mild pain, moderate pain or cramping.  Marland Kitchen. levothyroxine (SYNTHROID, LEVOTHROID) 100 MCG tablet Take 100 mcg by mouth daily before breakfast.  . mometasone (NASONEX) 50 MCG/ACT nasal spray Place 2 sprays into the nose daily.  . montelukast (SINGULAIR) 10 MG tablet Take 10 mg by mouth at bedtime.  . pantoprazole (PROTONIX) 40 MG tablet Take 40 mg by mouth daily.     Allergies:   Sulfa antibiotics; Percocet [oxycodone-acetaminophen]; Barbiturates; Ciprofloxacin; Codeine; Penicillins; and Propoxyphene   Social History   Socioeconomic History  .  Marital status: Married    Spouse name: Not on file  . Number of children: Not on file  . Years of education: Not on file  . Highest education level: Not on file  Occupational History  . Not on file  Social Needs  . Financial resource strain: Not on file  . Food insecurity:    Worry: Not on file    Inability: Not on file  . Transportation needs:    Medical: Not on file    Non-medical: Not on file  Tobacco Use  . Smoking status: Never Smoker  . Smokeless tobacco: Never Used  Substance and Sexual Activity  . Alcohol use: No  . Drug use: No  . Sexual activity: Not on file    Lifestyle  . Physical activity:    Days per week: Not on file    Minutes per session: Not on file  . Stress: Not on file  Relationships  . Social connections:    Talks on phone: Not on file    Gets together: Not on file    Attends religious service: Not on file    Active member of club or organization: Not on file    Attends meetings of clubs or organizations: Not on file    Relationship status: Not on file  Other Topics Concern  . Not on file  Social History Narrative  . Not on file     Family History: The patient's family history includes Cancer in her mother; Heart attack in her father.  ROS:   Please see the history of present illness.    ROS  All other systems reviewed and negative.   EKGs/Labs/Other Studies Reviewed:    The following studies were reviewed today: none  EKG:  EKG is  ordered today.  The ekg ordered today demonstrates normal sinus rhythm with left bundle branch block  Recent Labs: No results found for requested labs within last 8760 hours.   Recent Lipid Panel    Component Value Date/Time   CHOL 145 11/02/2013 0950   TRIG 58.0 11/02/2013 0950   HDL 63.00 11/02/2013 0950   CHOLHDL 2 11/02/2013 0950   VLDL 11.6 11/02/2013 0950   LDLCALC 70 11/02/2013 0950    Physical Exam:    VS:  BP 118/68   Pulse 68   Ht 4\' 11"  (1.499 m)   Wt 126 lb 9.6 oz (57.4 kg)   BMI 25.57 kg/m     Wt Readings from Last 3 Encounters:  05/08/18 126 lb 9.6 oz (57.4 kg)  04/30/17 132 lb (59.9 kg)  04/03/16 128 lb 12.8 oz (58.4 kg)     GEN:  Well nourished, well developed in no acute distress HEENT: Normal NECK: No JVD; No carotid bruits LYMPHATICS: No lymphadenopathy CARDIAC: RRR, no rubs, gallops.  2/6 systolic murmur the right upper sternal border with radiation to left lower sternal border. RESPIRATORY:  Clear to auscultation without rales, wheezing or rhonchi  ABDOMEN: Soft, non-tender, non-distended MUSCULOSKELETAL:  No edema; No deformity  SKIN: Warm  and dry NEUROLOGIC:  Alert and oriented x 3 PSYCHIATRIC:  Normal affect   ASSESSMENT:    1. PVC (premature ventricular contraction)   2. SVT (supraventricular tachycardia) (HCC)   3. Pulmonary HTN (HCC)   4. LBBB (left bundle branch block)   5. MURMUR   6. Exercise-induced leg cramps    PLAN:    In order of problems listed above:  1.  PVC - these are asymptomatic. Continue on CCB.  2.  SVT - she has not had any palpitations recently.  She will continue on Cardizem CD 180mg  daily  3.  Pulmonary HTN - resolved by echo 2016   4.  LBBB - coronary artery Ca score 0 and no CAD on coronary CTA.  5.  Heart murmur -I will check a 2D echocardiogram  6.  Leg cramps - She says that she gets cramping behind her knee calf but it occurs when she walks and when she is at rest.  It did not resolve when she stopped her statin drug.  She has decreased pulses in her posterior tibial artery so I will get lower extremity ABIs with and without exercise.  Medication Adjustments/Labs and Tests Ordered: Current medicines are reviewed at length with the patient today.  Concerns regarding medicines are outlined above.  Orders Placed This Encounter  Procedures  . EKG 12-Lead   No orders of the defined types were placed in this encounter.   Signed, Armanda Magic, MD  05/08/2018 10:38 AM    [facility]

## 2018-05-08 NOTE — Patient Instructions (Signed)
Medication Instructions:  Your physician recommends that you continue on your current medications as directed. Please refer to the Current Medication list given to you today.  If you need a refill on your cardiac medications before your next appointment, please call your pharmacy.   Lab work: None If you have labs (blood work) drawn today and your tests are completely normal, you will receive your results only by: Marland Kitchen MyChart Message (if you have MyChart) OR . A paper copy in the mail If you have any lab test that is abnormal or we need to change your treatment, we will call you to review the results.  Testing/Procedures: Your physician has requested that you have an ankle brachial index (ABI) with and without exercise. During this test an ultrasound and blood pressure cuff are used to evaluate the arteries that supply the arms and legs with blood. Allow thirty minutes for this exam. There are no restrictions or special instructions.  Your physician has requested that you have an echocardiogram. Echocardiography is a painless test that uses sound waves to create images of your heart. It provides your doctor with information about the size and shape of your heart and how well your heart's chambers and valves are working. This procedure takes approximately one hour. There are no restrictions for this procedure.  Follow-Up: At [facility], you and your health needs are our priority.  As part of our continuing mission to provide you with exceptional heart care, we have created designated Provider Care Teams.  These Care Teams include your primary Cardiologist (physician) and Advanced Practice Providers (APPs -  Physician Assistants and Nurse Practitioners) who all work together to provide you with the care you need, when you need it. You will need a follow up appointment in 1 years.  Please call our office 2 months in advance to schedule this appointment.  You may see Dr. Mayford Knife or one of the  following Advanced Practice Providers on your designated Care Team:   [place], PA-C Ronie Spies, PA-C . Jacolyn Reedy, PA-C

## 2018-05-15 ENCOUNTER — Other Ambulatory Visit: Payer: Self-pay | Admitting: Cardiology

## 2018-05-15 DIAGNOSIS — R252 Cramp and spasm: Secondary | ICD-10-CM

## 2018-05-22 ENCOUNTER — Ambulatory Visit (HOSPITAL_COMMUNITY)
Admission: RE | Admit: 2018-05-22 | Discharge: 2018-05-22 | Disposition: A | Discharge status: Home / Self Care | Payer: Medicare Other | Source: Ambulatory Visit | Attending: Internal Medicine | Admitting: Internal Medicine

## 2018-05-22 ENCOUNTER — Ambulatory Visit (HOSPITAL_BASED_OUTPATIENT_CLINIC_OR_DEPARTMENT_OTHER): Payer: Medicare Other

## 2018-05-22 DIAGNOSIS — I071 Rheumatic tricuspid insufficiency: Secondary | ICD-10-CM | POA: Insufficient documentation

## 2018-05-22 DIAGNOSIS — R011 Cardiac murmur, unspecified: Secondary | ICD-10-CM

## 2018-05-22 DIAGNOSIS — I447 Left bundle-branch block, unspecified: Secondary | ICD-10-CM | POA: Diagnosis not present

## 2018-05-22 DIAGNOSIS — I272 Pulmonary hypertension, unspecified: Secondary | ICD-10-CM | POA: Diagnosis not present

## 2018-05-22 DIAGNOSIS — I10 Essential (primary) hypertension: Secondary | ICD-10-CM | POA: Insufficient documentation

## 2018-05-22 DIAGNOSIS — R252 Cramp and spasm: Secondary | ICD-10-CM

## 2018-05-22 DIAGNOSIS — E785 Hyperlipidemia, unspecified: Secondary | ICD-10-CM | POA: Insufficient documentation

## 2018-05-29 ENCOUNTER — Other Ambulatory Visit: Payer: Self-pay | Admitting: Cardiology

## 2018-09-15 ENCOUNTER — Telehealth: Payer: Self-pay | Admitting: *Deleted

## 2018-09-15 NOTE — Telephone Encounter (Signed)
   [facility] Pre-operative Risk Assessment    Request for surgical clearance:  1. What type of surgery is being performed? RIGHT TOTAL KNEE ARTHROPLASTY  2. When is this surgery scheduled? TBD  3. What type of clearance is required (medical clearance vs. Pharmacy clearance to hold med vs. Both)? MEDICAL  4. Are there any medications that need to be held prior to surgery and how long? NONE LISTED  5. Practice name and name of physician performing surgery? [facility] ; DR/ JEFFREY YASTE  6. What is your office phone number [phone]   7.   What is your office fax number [phone]  8.   Anesthesia type (None, local, MAC, general) ? GENERAL   Adrienne Gonzales 09/15/2018, 2:10 PM  _________________________________________________________________   (provider comments below)

## 2018-09-15 NOTE — Telephone Encounter (Signed)
   Primary Cardiologist: Armanda Magic, MD  Chart reviewed as part of pre-operative protocol coverage. Patient was contacted 09/15/2018 in reference to pre-operative risk assessment for pending surgery as outlined below.  ALAYHA EDELMAN was last seen 05/08/18 by Dr. Mayford Knife. She has h/o asthma, pulm HTN (improved), PVCs, chronic LBBB, PACs, PSVT, HTN, daytime sleepiness, leg pain. Cardiac CT in 2015 showed no CAD. 2D echo 05/2018 EF 55-60%, impaired diastolic relaxation, RVSP normal. RCRI 0.4% indicating low risk of CV complications. I spoke with patient who affirms no new cardiac symptoms since last OV. Able to complete >4 METS without angina. Therefore, based on ACC/AHA guidelines, the patient would be at acceptable risk for the planned procedure without further cardiovascular testing.   I will route this recommendation to the requesting party via Epic fax function and remove from pre-op pool.  Please call with questions.  Laurann Montana, PA-C 09/15/2018, 3:35 PM

## 2019-05-07 NOTE — Progress Notes (Signed)
Cardiology Office Note:    Date:  05/08/2019   ID:  Adrienne Gonzales, DOB 1945-07-09, MRN [id number]  PCP:  Gennette Pac, NP  Cardiologist:  Fransico Him, MD    Referring MD: Gennette Pac, NP   Chief Complaint  Patient presents with  . Follow-up    PVCs, SVT, Pulmonary HTN, LBBB    History of Present Illness:    Adrienne Gonzales is a 74 y.o. female with a hx of asthma, moderate pulmonary HTN with PASP of 45-36mmHg in the past but echo 5/2016showedresolution of pulmonary HTN (this was most likely secondary to diastolic dysfunction), PVC's, chronic LBBBand PAC's and SVT up to 11 beats at 191bpm on Holter monitor, hypertension and daytime sleepiness but no OSA on sleep study.    She is here today for followup and is doing well.  She denies any chest pain or pressure, SOB, DOE, PND, orthopnea, LE edema, dizziness, palpitations or syncope. She is compliant with her meds and is tolerating meds with no SE.    Past Medical History:  Diagnosis Date  . Asthma   . H/O: hysterectomy   . Heart murmur   . History of vertigo   . HLD (hyperlipidemia)   . LBBB (left bundle branch block)   . PAC (premature atrial contraction)   . Pulmonary HTN ([place])    by echo 2015 moderate PASP 45-73mmHg.  Resolved on repeat echo 2016  . PVC (premature ventricular contraction)   . SVT (supraventricular tachycardia) ([place])   . Thyroid disease    synthroid    No past surgical history on file.  Current Medications: Current Meds  Medication Sig  . albuterol (PROVENTIL HFA;VENTOLIN HFA) 108 (90 BASE) MCG/ACT inhaler Inhale 1-2 puffs into the lungs every 6 (six) hours as needed for wheezing or shortness of breath.  . budesonide-formoterol (SYMBICORT) 80-4.5 MCG/ACT inhaler Inhale 2 puffs into the lungs 2 (two) times daily.  . cetirizine (ZYRTEC) 10 MG tablet Take 10 mg by mouth daily.  . Coenzyme Q10 (CO Q-10 PO) Take 1 tablet by mouth daily.  Marland Kitchen desonide (DESOWEN) 0.05 % cream Apply 1 application  topically 2 (two) times daily.  Marland Kitchen diltiazem (CARDIZEM CD) 180 MG 24 hr capsule TAKE ONE CAPSULE BY MOUTH DAILY  . ferrous fumarate (HEMOCYTE - 106 MG FE) 325 (106 FE) MG TABS tablet Take 1 tablet by mouth daily.  . Fluticasone-Salmeterol (ADVAIR) 500-50 MCG/DOSE AEPB Inhale 1 puff into the lungs 2 (two) times daily.  Marland Kitchen ibuprofen (ADVIL,MOTRIN) 200 MG tablet Take 200 mg by mouth every 6 (six) hours as needed for fever, headache, mild pain, moderate pain or cramping.  Marland Kitchen levothyroxine (SYNTHROID) 88 MCG tablet Take 88 mcg by mouth daily before breakfast.  . mometasone (NASONEX) 50 MCG/ACT nasal spray Place 2 sprays into the nose daily.  . montelukast (SINGULAIR) 10 MG tablet Take 10 mg by mouth at bedtime.  . pantoprazole (PROTONIX) 40 MG tablet Take 40 mg by mouth daily.     Allergies:   Sulfa antibiotics, Percocet [oxycodone-acetaminophen], Barbiturates, Ciprofloxacin, Codeine, Penicillins, and Propoxyphene   Social History   Socioeconomic History  . Marital status: Married    Spouse name: Not on file  . Number of children: Not on file  . Years of education: Not on file  . Highest education level: Not on file  Occupational History  . Not on file  Tobacco Use  . Smoking status: Never Smoker  . Smokeless tobacco: Never Used  Substance and Sexual  Activity  . Alcohol use: No  . Drug use: No  . Sexual activity: Not on file  Other Topics Concern  . Not on file  Social History Narrative  . Not on file   Social Determinants of Health   Financial Resource Strain:   . Difficulty of Paying Living Expenses: Not on file  Food Insecurity:   . Worried About Programme researcher, broadcasting/film/video in the Last Year: Not on file  . Ran Out of Food in the Last Year: Not on file  Transportation Needs:   . Lack of Transportation (Medical): Not on file  . Lack of Transportation (Non-Medical): Not on file  Physical Activity:   . Days of Exercise per Week: Not on file  . Minutes of Exercise per Session: Not on  file  Stress:   . Feeling of Stress : Not on file  Social Connections:   . Frequency of Communication with Friends and Family: Not on file  . Frequency of Social Gatherings with Friends and Family: Not on file  . Attends Religious Services: Not on file  . Active Member of Clubs or Organizations: Not on file  . Attends Banker Meetings: Not on file  . Marital Status: Not on file     Family History: The patient's family history includes Cancer in her mother; Heart attack in her father.  ROS:   Please see the history of present illness.    ROS  All other systems reviewed and negative.   EKGs/Labs/Other Studies Reviewed:    The following studies were reviewed today: EKG, labs  EKG:  EKG is  ordered today.  The ekg ordered today demonstrates NSR with LBBB  Recent Labs: No results found for requested labs within last 8760 hours.   Recent Lipid Panel    Component Value Date/Time   CHOL 145 11/02/2013 0950   TRIG 58.0 11/02/2013 0950   HDL 63.00 11/02/2013 0950   CHOLHDL 2 11/02/2013 0950   VLDL 11.6 11/02/2013 0950   LDLCALC 70 11/02/2013 0950    Physical Exam:    VS:  BP 122/70   Pulse 69   Ht 4\' 11"  (1.499 m)   Wt 119 lb 12.8 oz (54.3 kg)   SpO2 96%   BMI 24.20 kg/m     Wt Readings from Last 3 Encounters:  05/08/19 119 lb 12.8 oz (54.3 kg)  05/08/18 126 lb 9.6 oz (57.4 kg)  04/30/17 132 lb (59.9 kg)     GEN:  Well nourished, well developed in no acute distress HEENT: Normal NECK: No JVD; No carotid bruits LYMPHATICS: No lymphadenopathy CARDIAC: RRR, no murmurs, rubs, gallops RESPIRATORY:  Clear to auscultation without rales, wheezing or rhonchi  ABDOMEN: Soft, non-tender, non-distended MUSCULOSKELETAL:  No edema; No deformity  SKIN: Warm and dry NEUROLOGIC:  Alert and oriented x 3 PSYCHIATRIC:  Normal affect   ASSESSMENT:    1. PVC (premature ventricular contraction)   2. SVT (supraventricular tachycardia) (HCC)   3. Pulmonary HTN (HCC)    4. LBBB (left bundle branch block)   5. Exercise-induced leg cramps    PLAN:    In order of problems listed above:  1.  PVCs -asymptomatic -continue CCB  2.  SVT -denies any palpitations -continue Cardizem CD 180mg  daily  3.  Pulmonary HTN -resolved by echo 2016  4.  LBBB -coronary artery calcium score was 0 with no CAD on coronary CTA 2015 -denies any CP  5.  Leg cramps -LE arterial dopplers 05/2018 were  normal   Medication Adjustments/Labs and Tests Ordered: Current medicines are reviewed at length with the patient today.  Concerns regarding medicines are outlined above.  Orders Placed This Encounter  Procedures  . EKG 12-Lead   No orders of the defined types were placed in this encounter.   Signed, Armanda Magic, MD  05/08/2019 11:29 AM    [facility]

## 2019-05-08 ENCOUNTER — Ambulatory Visit: Payer: Medicare Other | Admitting: Cardiology

## 2019-05-08 ENCOUNTER — Encounter: Payer: Self-pay | Admitting: Cardiology

## 2019-05-08 ENCOUNTER — Other Ambulatory Visit: Payer: Self-pay

## 2019-05-08 VITALS — BP 122/70 | HR 69 | Ht 59.0 in | Wt 119.8 lb

## 2019-05-08 DIAGNOSIS — I447 Left bundle-branch block, unspecified: Secondary | ICD-10-CM

## 2019-05-08 DIAGNOSIS — I471 Supraventricular tachycardia: Secondary | ICD-10-CM | POA: Diagnosis not present

## 2019-05-08 DIAGNOSIS — I272 Pulmonary hypertension, unspecified: Secondary | ICD-10-CM | POA: Diagnosis not present

## 2019-05-08 DIAGNOSIS — I493 Ventricular premature depolarization: Secondary | ICD-10-CM

## 2019-05-08 DIAGNOSIS — R252 Cramp and spasm: Secondary | ICD-10-CM

## 2019-05-08 NOTE — Patient Instructions (Signed)

## 2019-05-28 ENCOUNTER — Other Ambulatory Visit: Payer: Self-pay | Admitting: Cardiology

## 2020-05-04 ENCOUNTER — Ambulatory Visit: Payer: Medicare Other | Admitting: Cardiology

## 2020-05-17 ENCOUNTER — Ambulatory Visit: Payer: Medicare Other | Admitting: Cardiology

## 2020-05-23 ENCOUNTER — Other Ambulatory Visit: Payer: Self-pay | Admitting: Cardiology

## 2020-06-02 ENCOUNTER — Other Ambulatory Visit: Payer: Self-pay

## 2020-06-02 ENCOUNTER — Ambulatory Visit: Payer: Medicare Other | Admitting: Cardiology

## 2020-06-02 ENCOUNTER — Encounter: Payer: Self-pay | Admitting: Cardiology

## 2020-06-02 VITALS — BP 120/80 | HR 72 | Ht <= 58 in | Wt 122.8 lb

## 2020-06-02 DIAGNOSIS — I493 Ventricular premature depolarization: Secondary | ICD-10-CM | POA: Diagnosis not present

## 2020-06-02 DIAGNOSIS — I471 Supraventricular tachycardia: Secondary | ICD-10-CM | POA: Diagnosis not present

## 2020-06-02 DIAGNOSIS — I447 Left bundle-branch block, unspecified: Secondary | ICD-10-CM | POA: Diagnosis not present

## 2020-06-02 DIAGNOSIS — I272 Pulmonary hypertension, unspecified: Secondary | ICD-10-CM

## 2020-06-02 DIAGNOSIS — I358 Other nonrheumatic aortic valve disorders: Secondary | ICD-10-CM

## 2020-06-02 MED ORDER — DILTIAZEM HCL ER COATED BEADS 180 MG PO CP24: 180.0000 mg | ORAL_CAPSULE | Freq: Every day | ORAL | 3 refills | Status: DC

## 2020-06-02 NOTE — Progress Notes (Addendum)
Cardiology Office Note:    Date:  06/02/2020   ID:  Adrienne Gonzales, DOB Apr 16, 1946, MRN [id number]  PCP:  Marcell Anger, NP  Cardiologist:  Armanda Magic, MD    Referring MD: Marcell Anger, NP   Chief Complaint  Patient presents with  . Follow-up    PVCs, SVT, pulmonary HTN, LBBB    History of Present Illness:    Adrienne Gonzales is a 75 y.o. female with a hx of asthma, moderate pulmonary HTN with PASP of 45-45mmHg in the past but echo 5/2016showedresolution of pulmonary HTN (this was most likely secondary to diastolic dysfunction), PVC's, chronic LBBBand PAC's and SVT up to 11 beats at 191bpm on Holter monitor, hypertension and daytime sleepiness but no OSA on sleep study.    She is here today for followup and is doing well.  She denies any chest pain or pressure, SOB, DOE, PND, orthopnea, dizziness or syncope. Rarely she will have some mild LE edema but usually not a problem.  She did have some palpitations last May but no problems since then. She is compliant with her meds and is tolerating meds with no SE.    Past Medical History:  Diagnosis Date  . Asthma   . H/O: hysterectomy   . Heart murmur   . History of vertigo   . HLD (hyperlipidemia)   . LBBB (left bundle branch block)   . PAC (premature atrial contraction)   . Pulmonary HTN ([facility])    by echo 2015 moderate PASP 45-55mmHg.  Resolved on repeat echo 2016  . PVC (premature ventricular contraction)   . SVT (supraventricular tachycardia) (H[facility])   . Thyroid disease    synthroid    History reviewed. No pertinent surgical history.  Current Medications: Current Meds  Medication Sig  . acetaminophen (TYLENOL) 325 MG tablet Take by mouth.  Marland Kitchen albuterol (PROVENTIL HFA;VENTOLIN HFA) 108 (90 BASE) MCG/ACT inhaler Inhale 1-2 puffs into the lungs every 6 (six) hours as needed for wheezing or shortness of breath.  Marland Kitchen aspirin 81 MG EC tablet Take by mouth.  . budesonide-formoterol (SYMBICORT) 80-4.5 MCG/ACT inhaler  Inhale 2 puffs into the lungs 2 (two) times daily.  . cetirizine (ZYRTEC) 10 MG tablet Take 10 mg by mouth daily.  . Coenzyme Q10 (CO Q-10 PO) Take 1 tablet by mouth daily.  Marland Kitchen desonide (DESOWEN) 0.05 % cream Apply 1 application topically 2 (two) times daily.  Marland Kitchen FERROCITE 324 MG TABS tablet Take 1 tablet by mouth 2 (two) times daily.  . ferrous fumarate (HEMOCYTE - 106 MG FE) 325 (106 FE) MG TABS tablet Take 1 tablet by mouth daily.  . fluticasone (FLONASE) 50 MCG/ACT nasal spray Place into the nose.  Marland Kitchen Fluticasone-Salmeterol (ADVAIR) 500-50 MCG/DOSE AEPB Inhale 1 puff into the lungs 2 (two) times daily.  Marland Kitchen ibuprofen (ADVIL,MOTRIN) 200 MG tablet Take 200 mg by mouth every 6 (six) hours as needed for fever, headache, mild pain, moderate pain or cramping.  Marland Kitchen levothyroxine (SYNTHROID) 88 MCG tablet Take 88 mcg by mouth daily before breakfast.  . meclizine (ANTIVERT) 25 MG tablet Take by mouth.  . meloxicam (MOBIC) 7.5 MG tablet Take 7.5 mg by mouth 2 (two) times daily.  . mometasone (NASONEX) 50 MCG/ACT nasal spray Place 2 sprays into the nose daily.  . montelukast (SINGULAIR) 10 MG tablet Take 10 mg by mouth at bedtime.  . pantoprazole (PROTONIX) 40 MG tablet Take 40 mg by mouth daily.  . pravastatin (PRAVACHOL) 40 MG tablet Take  by mouth.  . Psyllium (SM FIBER) 48.57 % POWD   . tobramycin-dexamethasone (TOBRADEX) ophthalmic solution Place 1 drop into the left eye 4 (four) times daily.  . [DISCONTINUED] diltiazem (CARDIZEM CD) 180 MG 24 hr capsule TAKE ONE CAPSULE BY MOUTH DAILY     Allergies:   Sulfa antibiotics, Percocet [oxycodone-acetaminophen], Barbiturates, Ciprofloxacin, Codeine, Penicillins, and Propoxyphene   Social History   Socioeconomic History  . Marital status: Married    Spouse name: Not on file  . Number of children: Not on file  . Years of education: Not on file  . Highest education level: Not on file  Occupational History  . Not on file  Tobacco Use  . Smoking status:  Never Smoker  . Smokeless tobacco: Never Used  Vaping Use  . Vaping Use: Never used  Substance and Sexual Activity  . Alcohol use: No  . Drug use: No  . Sexual activity: Not on file  Other Topics Concern  . Not on file  Social History Narrative  . Not on file   Social Determinants of Health   Financial Resource Strain: Not on file  Food Insecurity: Not on file  Transportation Needs: Not on file  Physical Activity: Not on file  Stress: Not on file  Social Connections: Not on file     Family History: The patient's family history includes Cancer in her mother; Heart attack in her father.  ROS:   Please see the history of present illness.    ROS  All other systems reviewed and negative.   EKGs/Labs/Other Studies Reviewed:    The following studies were reviewed today: EKG, labs  EKG:  EKG is  ordered today.  The ekg ordered today demonstrates NSR with iLBBB  Recent Labs: No results found for requested labs within last 8760 hours.   Recent Lipid Panel    Component Value Date/Time   CHOL 145 11/02/2013 0950   TRIG 58.0 11/02/2013 0950   HDL 63.00 11/02/2013 0950   CHOLHDL 2 11/02/2013 0950   VLDL 11.6 11/02/2013 0950   LDLCALC 70 11/02/2013 0950    Physical Exam:    VS:  BP 120/80   Pulse 72   Ht 4\' 9"  (1.448 m)   Wt 122 lb 12.8 oz (55.7 kg)   SpO2 99%   BMI 26.57 kg/m     Wt Readings from Last 3 Encounters:  06/02/20 122 lb 12.8 oz (55.7 kg)  05/08/19 119 lb 12.8 oz (54.3 kg)  05/08/18 126 lb 9.6 oz (57.4 kg)     GEN: Well nourished, well developed in no acute distress HEENT: Normal NECK: No JVD; No carotid bruits LYMPHATICS: No lymphadenopathy CARDIAC:RRR, no rubs, gallops.  1/6 SM at RUSB to LUSB RESPIRATORY:  Clear to auscultation without rales, wheezing or rhonchi  ABDOMEN: Soft, non-tender, non-distended MUSCULOSKELETAL:  No edema; No deformity  SKIN: Warm and dry NEUROLOGIC:  Alert and oriented x 3 PSYCHIATRIC:  Normal affect    ASSESSMENT:    1. PVC (premature ventricular contraction)   2. SVT (supraventricular tachycardia) (HCC)   3. Pulmonary HTN (HCC)   4. LBBB (left bundle branch block)   5. Aortic valve sclerosis    PLAN:    In order of problems listed above:  1.  PVCs -she had some skipped heart beats back in May for a day or 2 but no other palpitations.  -continue CCB  2.  SVT -this has not reoccurred -continue Cardizem CD 180mg  daily  3.  Pulmonary HTN -  resolved by echo 2016  4.  LBBB -coronary artery calcium score was 0 with no CAD on coronary CTA 2015 -denies any CP  5.  Leg cramps -LE arterial dopplers 05/2018 were normal  6.  Heart murmur -she has a systolic murmur with hx of AVSC but echo 2020  Followup with me in 1 year   Medication Adjustments/Labs and Tests Ordered: Current medicines are reviewed at length with the patient today.  Concerns regarding medicines are outlined above.  Orders Placed This Encounter  Procedures  . EKG 12-Lead   Meds ordered this encounter  Medications  . diltiazem (CARDIZEM CD) 180 MG 24 hr capsule    Sig: Take 1 capsule (180 mg total) by mouth daily.    Dispense:  90 capsule    Refill:  3    Please keep your upcoming appointment for any future refills. Thank you.    Signed, Armanda Magic, MD  06/02/2020 11:08 AM    [facility]

## 2020-06-02 NOTE — Patient Instructions (Signed)
Medication Instructions:  Your physician recommends that you continue on your current medications as directed. Please refer to the Current Medication list given to you today.  *If you need a refill on your cardiac medications before your next appointment, please call your pharmacy*  Follow-Up: At [facility], you and your health needs are our priority.  As part of our continuing mission to provide you with exceptional heart care, we have created designated Provider Care Teams.  These Care Teams include your primary Cardiologist (physician) and Advanced Practice Providers (APPs -  Physician Assistants and Nurse Practitioners) who all work together to provide you with the care you need, when you need it.   Your next appointment:   1 year(s)  The format for your next appointment:   In Person  Provider:   You may see Armanda Magic, MD or one of the following Advanced Practice Providers on your designated Care Team:    Georgie Chard, NP

## 2021-05-25 ENCOUNTER — Other Ambulatory Visit: Payer: Self-pay | Admitting: Cardiology

## 2021-07-20 NOTE — Progress Notes (Signed)
?Cardiology Office Note:   ? ?Date:  07/21/2021  ? ?ID:  Adrienne Gladeancy L Fritchman, DOB 09/23/1945, MRN [id number] ? ?PCP:  Marcell Angerlark, Candice P, NP  ?Cardiologist:  Armanda Magicraci Elizzie Westergard, MD   ? ?Referring MD: Marcell Angerlark, Candice P, NP  ? ?Chief Complaint  ?Patient presents with  ? Follow-up  ?  PVCs, SVT, pulmonary hypertension, left bundle branch block, aortic valve sclerosis  ? ? ?History of Present Illness:   ? ?Adrienne Gonzales is a 76 y.o. female with a hx of asthma, moderate pulmonary HTN with PASP of 45-1350mmHg in the past but echo 08/2014 showed resolution of pulmonary HTN (this was most likely secondary to diastolic dysfunction), PVC's, chronic LBBB and PAC's and SVT up to 11 beats at 191bpm on Holter monitor, hypertension and daytime sleepiness but no OSA on sleep study.   ? ?Adrienne Gonzales is here today for followup and is doing well.  She denies any chest pain or pressure, SOB, DOE, PND, orthopnea, LE edema or syncope. She has had some problems with vertigo.  She occasionally notices a fip flop of her heart beat but nothing sustained. She is compliant with her meds and is tolerating meds with no SE.   ?  ?Past Medical History:  ?Diagnosis Date  ? Asthma   ? H/O: hysterectomy   ? Heart murmur   ? History of vertigo   ? HLD (hyperlipidemia)   ? LBBB (left bundle branch block)   ? PAC (premature atrial contraction)   ? Pulmonary HTN ([facility]CC)   ? by echo 2015 moderate PASP 45-8350mmHg.  Resolved on repeat echo 2016  ? PVC (premature ventricular contraction)   ? SVT (supraventricular tachycardia) (HCC)   ? Thyroid disease   ? synthroid  ? ? ?No past surgical history on file. ? ?Current Medications: ?Current Meds  ?Medication Sig  ? acetaminophen (TYLENOL) 325 MG tablet Take by mouth.  ? albuterol (PROVENTIL HFA;VENTOLIN HFA) 108 (90 BASE) MCG/ACT inhaler Inhale 1-2 puffs into the lungs every 6 (six) hours as needed for wheezing or shortness of breath.  ? aspirin 81 MG EC tablet Take by mouth.  ? azelastine (ASTELIN) 0.1 % nasal spray Place 2 sprays  into both nostrils as needed for congestion.  ? budesonide-formoterol (SYMBICORT) 80-4.5 MCG/ACT inhaler Inhale 2 puffs into the lungs 2 (two) times daily.  ? cetirizine (ZYRTEC) 10 MG tablet Take 10 mg by mouth daily.  ? Coenzyme Q10 (CO Q-10 PO) Take 1 tablet by mouth daily.  ? desonide (DESOWEN) 0.05 % cream Apply 1 application topically 2 (two) times daily.  ? FERROCITE 324 MG TABS tablet Take 1 tablet by mouth 2 (two) times daily.  ? ferrous fumarate (HEMOCYTE - 106 MG FE) 325 (106 FE) MG TABS tablet Take 1 tablet by mouth daily.  ? fluticasone (FLONASE) 50 MCG/ACT nasal spray Place into the nose.  ? Fluticasone-Salmeterol (ADVAIR) 500-50 MCG/DOSE AEPB Inhale 1 puff into the lungs 2 (two) times daily.  ? ibuprofen (ADVIL,MOTRIN) 200 MG tablet Take 200 mg by mouth every 6 (six) hours as needed for fever, headache, mild pain, moderate pain or cramping.  ? levothyroxine (SYNTHROID) 88 MCG tablet Take 88 mcg by mouth daily before breakfast.  ? meclizine (ANTIVERT) 25 MG tablet Take by mouth.  ? meloxicam (MOBIC) 7.5 MG tablet Take 7.5 mg by mouth 2 (two) times daily.  ? mometasone (NASONEX) 50 MCG/ACT nasal spray Place 2 sprays into the nose daily.  ? montelukast (SINGULAIR) 10 MG tablet Take 10 mg by  mouth at bedtime.  ? pantoprazole (PROTONIX) 40 MG tablet Take 40 mg by mouth daily.  ? pravastatin (PRAVACHOL) 40 MG tablet Take by mouth.  ? Psyllium (SM FIBER) 48.57 % POWD   ? traZODone (DESYREL) 50 MG tablet Take 50 mg by mouth as needed for sleep.  ? [DISCONTINUED] diltiazem (CARDIZEM CD) 180 MG 24 hr capsule Take 1 capsule (180 mg total) by mouth daily. Please keep upcoming appt in April 2023 with Dr. Mayford Knife before anymore refills. Thank you  ?  ? ?Allergies:   Sulfa antibiotics, Percocet [oxycodone-acetaminophen], Barbiturates, Ciprofloxacin, Codeine, Penicillins, and Propoxyphene  ? ?Social History  ? ?Socioeconomic History  ? Marital status: Married  ?  Spouse name: Not on file  ? Number of children: Not on  file  ? Years of education: Not on file  ? Highest education level: Not on file  ?Occupational History  ? Not on file  ?Tobacco Use  ? Smoking status: Never  ? Smokeless tobacco: Never  ?Vaping Use  ? Vaping Use: Never used  ?Substance and Sexual Activity  ? Alcohol use: No  ? Drug use: No  ? Sexual activity: Not on file  ?Other Topics Concern  ? Not on file  ?Social History Narrative  ? Not on file  ? ?Social Determinants of Health  ? ?Financial Resource Strain: Not on file  ?Food Insecurity: Not on file  ?Transportation Needs: Not on file  ?Physical Activity: Not on file  ?Stress: Not on file  ?Social Connections: Not on file  ?  ? ?Family History: ?The patient's family history includes Cancer in her mother; Heart attack in her father. ? ?ROS:   ?Please see the history of present illness.    ?ROS  ?All other systems reviewed and negative.  ? ?EKGs/Labs/Other Studies Reviewed:   ? ?The following studies were reviewed today: ?EKG, labs ? ?EKG:  EKG is  ordered today.  The ekg ordered today demonstrates NSR with LBBB ? ?Recent Labs: ?No results found for requested labs within last 8760 hours.  ? ?Recent Lipid Panel ?   ?Component Value Date/Time  ? CHOL 145 11/02/2013 0950  ? TRIG 58.0 11/02/2013 0950  ? HDL 63.00 11/02/2013 0950  ? CHOLHDL 2 11/02/2013 0950  ? VLDL 11.6 11/02/2013 0950  ? LDLCALC 70 11/02/2013 0950  ? ? ?Physical Exam:   ? ?VS:  BP 112/70   Pulse 75   Ht 4\' 11"  (1.499 m)   Wt 122 lb 3.2 oz (55.4 kg)   SpO2 97%   BMI 24.68 kg/m?    ? ?Wt Readings from Last 3 Encounters:  ?07/21/21 122 lb 3.2 oz (55.4 kg)  ?06/02/20 122 lb 12.8 oz (55.7 kg)  ?05/08/19 119 lb 12.8 oz (54.3 kg)  ?  ?GEN: Well nourished, well developed in no acute distress ?HEENT: Normal ?NECK: No JVD; No carotid bruits ?LYMPHATICS: No lymphadenopathy ?CARDIAC:RRR, no rubs, gallops.  2/6 SM at RUSB to LLSB ?RESPIRATORY:  Clear to auscultation without rales, wheezing or rhonchi  ?ABDOMEN: Soft, non-tender,  non-distended ?MUSCULOSKELETAL:  No edema; No deformity  ?SKIN: Warm and dry ?NEUROLOGIC:  Alert and oriented x 3 ?PSYCHIATRIC:  Normal affect   ? ?ASSESSMENT:   ? ?1. PVC (premature ventricular contraction)   ?2. SVT (supraventricular tachycardia) (HCC)   ?3. Pulmonary HTN (HCC)   ?4. LBBB (left bundle branch block)   ?5. Aortic valve sclerosis   ? ?PLAN:   ? ?In order of problems listed above: ? ?1.  PVCs ?-  She has had no further sustained palpitations since I saw her last and only has a flip flop occasionally ?-Continue prescription drug management with Cardizem CD 180 mg daily with as needed refills ? ?2.  SVT ?-She has not had any recurrence of SVT ?-Continue Cardizem CD 180 mg daily ? ?3.  Pulmonary HTN ?-resolved by echo 2016 ? ?4.  LBBB ?-coronary artery calcium score was 0 with no CAD on coronary CTA 2015 ?-She has not had any chest pain ? ?5.  Leg cramps ?-LE arterial dopplers 05/2018 were normal ? ?6.  Heart murmur/AVSC ?-she has a systolic murmur with hx of AVSC but echo 2020 ?-repeat 2D echo to make sure she has not developed AS ? ?Followup with me in 1 year ? ? ?Medication Adjustments/Labs and Tests Ordered: ?Current medicines are reviewed at length with the patient today.  Concerns regarding medicines are outlined above.  ?Orders Placed This Encounter  ?Procedures  ? EKG 12-Lead  ? ?Meds ordered this encounter  ?Medications  ? diltiazem (CARDIZEM CD) 180 MG 24 hr capsule  ?  Sig: Take 1 capsule (180 mg total) by mouth daily. Please keep upcoming appt in April 2023 with Dr. Mayford Knife before anymore refills. Thank you  ?  Dispense:  90 capsule  ?  Refill:  0  ?  Pt must keep upcoming appointment with Dr. Mayford Knife in April 2023 before anymore refills. Thank you  ? ? ?Signed, ?Armanda Magic, MD  ?07/21/2021 10:30 AM    ?[facility] ?

## 2021-07-21 ENCOUNTER — Encounter: Payer: Self-pay | Admitting: Cardiology

## 2021-07-21 ENCOUNTER — Ambulatory Visit: Payer: Medicare Other | Admitting: Cardiology

## 2021-07-21 ENCOUNTER — Encounter (INDEPENDENT_AMBULATORY_CARE_PROVIDER_SITE_OTHER): Payer: Self-pay

## 2021-07-21 VITALS — BP 112/70 | HR 75 | Ht 59.0 in | Wt 122.2 lb

## 2021-07-21 DIAGNOSIS — I493 Ventricular premature depolarization: Secondary | ICD-10-CM | POA: Diagnosis not present

## 2021-07-21 DIAGNOSIS — I471 Supraventricular tachycardia: Secondary | ICD-10-CM

## 2021-07-21 DIAGNOSIS — I272 Pulmonary hypertension, unspecified: Secondary | ICD-10-CM | POA: Diagnosis not present

## 2021-07-21 DIAGNOSIS — I358 Other nonrheumatic aortic valve disorders: Secondary | ICD-10-CM

## 2021-07-21 DIAGNOSIS — R011 Cardiac murmur, unspecified: Secondary | ICD-10-CM

## 2021-07-21 DIAGNOSIS — I447 Left bundle-branch block, unspecified: Secondary | ICD-10-CM

## 2021-07-21 MED ORDER — DILTIAZEM HCL ER COATED BEADS 180 MG PO CP24: 180.0000 mg | ORAL_CAPSULE | Freq: Every day | ORAL | 3 refills | Status: DC

## 2021-07-21 MED ORDER — DILTIAZEM HCL ER COATED BEADS 180 MG PO CP24: 180.0000 mg | ORAL_CAPSULE | Freq: Every day | ORAL | 0 refills | Status: DC

## 2021-07-21 NOTE — Patient Instructions (Signed)
Medication Instructions:  ?Your physician recommends that you continue on your current medications as directed. Please refer to the Current Medication list given to you today. ? ?*If you need a refill on your cardiac medications before your next appointment, please call your pharmacy* ? ? ?Lab Work: ?NONE ?If you have labs (blood work) drawn today and your tests are completely normal, you will receive your results only by: ?MyChart Message (if you have MyChart) OR ?A paper copy in the mail ?If you have any lab test that is abnormal or we need to change your treatment, we will call you to review the results. ? ? ?Testing/Procedures: ?Your physician has requested that you have an echocardiogram. Echocardiography is a painless test that uses sound waves to create images of your heart. It provides your doctor with information about the size and shape of your heart and how well your heart?s chambers and valves are working. This procedure takes approximately one hour. There are no restrictions for this procedure. ? ? ? ?Follow-Up: ?At [facility], you and your health needs are our priority.  As part of our continuing mission to provide you with exceptional heart care, we have created designated Provider Care Teams.  These Care Teams include your primary Cardiologist (physician) and Advanced Practice Providers (APPs -  Physician Assistants and Nurse Practitioners) who all work together to provide you with the care you need, when you need it. ? ?We recommend signing up for the patient portal called "MyChart".  Sign up information is provided on this After Visit Summary.  MyChart is used to connect with patients for Virtual Visits (Telemedicine).  Patients are able to view lab/test results, encounter notes, upcoming appointments, etc.  Non-urgent messages can be sent to your provider as well.   ?To learn more about what you can do with MyChart, go to [email or web address].   ? ?Your next appointment:   ?1 year(s) ? ?The  format for your next appointment:   ?In Person ? ?Provider:   ?Armanda Magic, MD   ? ?

## 2021-07-21 NOTE — Addendum Note (Signed)
Addended by: Precious Gilding on: 07/21/2021 10:53 AM ? ? Modules accepted: Orders ? ?

## 2021-07-21 NOTE — Addendum Note (Signed)
Addended by: Precious Gilding on: 07/21/2021 10:39 AM ? ? Modules accepted: Orders ? ?

## 2021-08-09 ENCOUNTER — Ambulatory Visit (HOSPITAL_COMMUNITY): Payer: Medicare Other | Attending: Cardiology

## 2021-08-09 DIAGNOSIS — I358 Other nonrheumatic aortic valve disorders: Secondary | ICD-10-CM | POA: Diagnosis present

## 2021-08-09 DIAGNOSIS — I493 Ventricular premature depolarization: Secondary | ICD-10-CM | POA: Diagnosis present

## 2021-08-09 DIAGNOSIS — R011 Cardiac murmur, unspecified: Secondary | ICD-10-CM | POA: Insufficient documentation

## 2021-08-09 LAB — ECHOCARDIOGRAM COMPLETE
AR max vel: 1.13 cm2
AV Area VTI: 1.19 cm2
AV Area mean vel: 1.11 cm2
AV Mean grad: 11.7 mmHg
AV Peak grad: 20.8 mmHg
Ao pk vel: 2.28 m/s
Area-P 1/2: 4.39 cm2
S' Lateral: 3.1 cm

## 2021-08-10 ENCOUNTER — Telehealth: Payer: Self-pay | Admitting: Cardiology

## 2021-08-10 NOTE — Telephone Encounter (Signed)
Patient was returning Dr. Mayford Knife or nurse's call back ?

## 2021-08-10 NOTE — Telephone Encounter (Signed)
Adrienne Reichert, MD  ?08/10/2021 12:34 PM EDT   ?  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 Adrienne Reichert, MD  ?08/10/2021 12:34 PM EDT   ?  ?Echo showed low normal LV function with EF 50 to 55% and increased deafness of the heart muscle.  There is mild leakiness of the mitral valve and mild calcification of the aortic valve  ? ?Results have been reviewed with pt who states understanding.   ?

## 2022-07-22 ENCOUNTER — Emergency Department (HOSPITAL_BASED_OUTPATIENT_CLINIC_OR_DEPARTMENT_OTHER)
Admission: EM | Admit: 2022-07-22 | Discharge: 2022-07-22 | Disposition: A | Discharge status: Home / Self Care | Payer: Medicare HMO | Attending: Emergency Medicine | Admitting: Emergency Medicine

## 2022-07-22 ENCOUNTER — Emergency Department (HOSPITAL_BASED_OUTPATIENT_CLINIC_OR_DEPARTMENT_OTHER): Payer: Medicare HMO

## 2022-07-22 DIAGNOSIS — Z79899 Other long term (current) drug therapy: Secondary | ICD-10-CM | POA: Diagnosis not present

## 2022-07-22 DIAGNOSIS — M25512 Pain in left shoulder: Secondary | ICD-10-CM

## 2022-07-22 DIAGNOSIS — Z7982 Long term (current) use of aspirin: Secondary | ICD-10-CM | POA: Insufficient documentation

## 2022-07-22 DIAGNOSIS — I10 Essential (primary) hypertension: Secondary | ICD-10-CM | POA: Insufficient documentation

## 2022-07-22 LAB — CBC
HCT: 36.9 % (ref 36.0–46.0)
Hemoglobin: 12.1 g/dL (ref 12.0–15.0)
MCH: 31.2 pg (ref 26.0–34.0)
MCHC: 32.8 g/dL (ref 30.0–36.0)
MCV: 95.1 fL (ref 80.0–100.0)
Platelets: 363 10*3/uL (ref 150–400)
RBC: 3.88 MIL/uL (ref 3.87–5.11)
RDW: 12.3 % (ref 11.5–15.5)
WBC: 9.1 10*3/uL (ref 4.0–10.5)
nRBC: 0 % (ref 0.0–0.2)

## 2022-07-22 LAB — BASIC METABOLIC PANEL
Anion gap: 9 (ref 5–15)
BUN: 17 mg/dL (ref 8–23)
CO2: 26 mmol/L (ref 22–32)
Calcium: 9.6 mg/dL (ref 8.9–10.3)
Chloride: 104 mmol/L (ref 98–111)
Creatinine, Ser: 0.55 mg/dL (ref 0.44–1.00)
GFR, Estimated: >60 mL/min (ref >60)
Glucose, Bld: 108 mg/dL — ABNORMAL HIGH (ref 70–99)
Potassium: 3.9 mmol/L (ref 3.5–5.1)
Sodium: 139 mmol/L (ref 135–145)

## 2022-07-22 LAB — TROPONIN I (HIGH SENSITIVITY)
Troponin I (High Sensitivity): 4 ng/L (ref <18)
Troponin I (High Sensitivity): 4 ng/L (ref <18)

## 2022-07-22 MED ORDER — LIDOCAINE 5 % EX PTCH: 1.0000 | MEDICATED_PATCH | CUTANEOUS | 0 refills | Status: DC

## 2022-07-22 MED ORDER — TRAMADOL HCL 50 MG PO TABS: 50.0000 mg | ORAL_TABLET | Freq: Four times a day (QID) | ORAL | 0 refills | Status: AC | PRN

## 2022-07-22 MED ORDER — TRAMADOL HCL 50 MG PO TABS
50.0000 mg | ORAL_TABLET | Freq: Once | ORAL | Status: AC
  Administered 2022-07-22: 50 mg via ORAL
  Filled 2022-07-22: qty 1

## 2022-07-22 NOTE — Discharge Instructions (Signed)
Your history, exam, workup today did not show cardiac or lung cause of your left arm and shoulder discomfort today.  I suspect your symptoms are more musculoskeletal with spasm and pain and tenderness this feels the shoulder immobilizer, prescription for numbing patches, and some pain medicine to help treat.  Please follow-up with sports medicine or orthopedics for further management.  We had a shared decision-making conversation given her lack of trauma, we agreed to hold on advanced imaging of your neck however if your symptoms were to become more radicular or nerve sounding or you develop new numbness or weakness, you may need outpatient MRI if the outpatient team and follow-up recommended this.  Please rest and stay hydrated.  If any symptoms change or worsen acutely, please return to the nearest emergency department.

## 2022-07-22 NOTE — ED Notes (Signed)
FLOAT NURSE 

## 2022-07-22 NOTE — ED Provider Notes (Signed)
Adrienne Gonzales Provider Note   CSN: [id number] Arrival date & time: 07/22/22  2024     History  Chief Complaint  Patient presents with   Shoulder Pain    Adrienne Gonzales is a 77 y.o. female.  The history is provided by the patient and medical records. No language interpreter was used.  Shoulder Pain Location:  Shoulder Shoulder location:  L shoulder Injury: no   Pain details:    Quality:  Aching and cramping   Radiates to:  Does not radiate   Severity:  Moderate   Onset quality:  Gradual   Duration:  5 hours   Progression:  Waxing and waning Dislocation: no   Prior injury to area:  No Relieved by:  Nothing Worsened by:  Movement and stretching area Ineffective treatments:  None tried Associated symptoms: no back pain, no fatigue, no fever, no muscle weakness, no neck pain, no numbness, no stiffness, no swelling and no tingling        Home Medications Prior to Admission medications   Medication Sig Start Date End Date Taking? Authorizing Provider  acetaminophen (TYLENOL) 325 MG tablet Take by mouth.    [provider]  albuterol (PROVENTIL HFA;VENTOLIN HFA) 108 (90 BASE) MCG/ACT inhaler Inhale 1-2 puffs into the lungs every 6 (six) hours as needed for wheezing or shortness of breath.    [provider]  aspirin 81 MG EC tablet Take by mouth.    [provider]  azelastine (ASTELIN) 0.1 % nasal spray Place 2 sprays into both nostrils as needed for congestion. 02/10/21   [provider]  budesonide-formoterol (SYMBICORT) 80-4.5 MCG/ACT inhaler Inhale 2 puffs into the lungs 2 (two) times daily.    [provider]  cetirizine (ZYRTEC) 10 MG tablet Take 10 mg by mouth daily.    [provider]  Coenzyme Q10 (CO Q-10 PO) Take 1 tablet by mouth daily.    [provider]  desonide (DESOWEN) 0.05 % cream Apply 1 application topically 2 (two) times daily.    [provider]  diltiazem (CARDIZEM CD) 180 MG 24 hr capsule Take 1 capsule (180 mg total) by mouth daily. Please keep upcoming appt in April 2023 with Dr. Mayford Knife before anymore refills. Thank you 07/21/21   Quintella Reichert, MD  FERROCITE 324 MG TABS tablet Take 1 tablet by mouth 2 (two) times daily. 05/10/20   [provider]  ferrous fumarate (HEMOCYTE - 106 MG FE) 325 (106 FE) MG TABS tablet Take 1 tablet by mouth daily.    [provider]  fluticasone (FLONASE) 50 MCG/ACT nasal spray Place into the nose.    [provider]  Fluticasone-Salmeterol (ADVAIR) 500-50 MCG/DOSE AEPB Inhale 1 puff into the lungs 2 (two) times daily.    [provider]  ibuprofen (ADVIL,MOTRIN) 200 MG tablet Take 200 mg by mouth every 6 (six) hours as needed for fever, headache, mild pain, moderate pain or cramping.    [provider]  levothyroxine (SYNTHROID) 88 MCG tablet Take 88 mcg by mouth daily before breakfast.    [provider]  meclizine (ANTIVERT) 25 MG tablet Take by mouth. 04/01/20   [provider]  meloxicam (MOBIC) 7.5 MG tablet Take 7.5 mg by mouth 2 (two) times daily. 04/21/20   [provider]  mometasone (NASONEX) 50 MCG/ACT nasal spray Place 2 sprays into the nose daily.    [provider]  montelukast (SINGULAIR) 10 MG  tablet Take 10 mg by mouth at bedtime.    [provider]  pantoprazole (PROTONIX) 40 MG tablet Take 40 mg by mouth daily.    [provider]  pravastatin (PRAVACHOL) 40 MG tablet Take by mouth. 06/24/19   [provider]  Psyllium (SM FIBER) 48.57 % POWD     [provider]  traZODone (DESYREL) 50 MG tablet Take 50 mg by mouth as needed for sleep. 05/11/21   [provider]      Allergies    Sulfa antibiotics, Percocet [oxycodone-acetaminophen], Barbiturates, Ciprofloxacin, Codeine, Penicillins, and Propoxyphene    Review of Systems   Review of Systems   Constitutional:  Negative for diaphoresis, fatigue and fever.  HENT:  Negative for congestion.   Respiratory:  Negative for cough, chest tightness, shortness of breath and wheezing.   Cardiovascular:  Negative for chest pain and palpitations.  Gastrointestinal:  Negative for abdominal pain.  Genitourinary:  Negative for dyspareunia and flank pain.  Musculoskeletal:  Negative for back pain, neck pain, neck stiffness and stiffness.  Skin:  Negative for rash and wound.  Neurological:  Negative for dizziness, weakness, light-headedness and headaches.  Psychiatric/Behavioral:  Negative for agitation.   All other systems reviewed and are negative.   Physical Exam Updated Vital Signs BP (!) 150/67   Pulse 95   Temp 98 F (36.7 C) (Oral)   Resp 18   SpO2 99%  Physical Exam Vitals and nursing note reviewed.  Constitutional:      General: She is not in acute distress.    Appearance: She is well-developed. She is not ill-appearing, toxic-appearing or diaphoretic.  HENT:     Head: Normocephalic and atraumatic.     Nose: Nose normal.     Mouth/Throat:     Mouth: Mucous membranes are moist.  Eyes:     Conjunctiva/sclera: Conjunctivae normal.  Neck:     Vascular: No carotid bruit.     Comments: Spurling negative Cardiovascular:     Rate and Rhythm: Normal rate and regular rhythm.     Heart sounds: No murmur heard. Pulmonary:     Effort: Pulmonary effort is normal. No respiratory distress.     Breath sounds: Normal breath sounds. No wheezing, rhonchi or rales.  Chest:     Chest wall: No tenderness.  Abdominal:     Palpations: Abdomen is soft.     Tenderness: There is no abdominal tenderness. There is no guarding or rebound.  Musculoskeletal:        General: Tenderness present. No swelling.     Cervical back: Neck supple. No tenderness.     Right lower leg: No edema.     Left lower leg: No edema.  Skin:    General: Skin is warm and dry.     Capillary Refill: Capillary refill  takes less than 2 seconds.     Findings: No erythema or rash.  Neurological:     General: No focal deficit present.     Mental Status: She is alert.     Sensory: No sensory deficit.     Motor: No weakness.  Psychiatric:        Mood and Affect: Mood normal.     ED Results / Procedures / Treatments   Labs (all labs ordered are listed, but only abnormal results are displayed) Labs Reviewed  BASIC METABOLIC PANEL - Abnormal; Notable for the following components:      Result Value   Glucose, Bld 108 (*)  All other components within normal limits  CBC  TROPONIN I (HIGH SENSITIVITY)  TROPONIN I (HIGH SENSITIVITY)    EKG EKG Interpretation  Date/Time:  Sunday July 22 2022 20:40:36 EDT Ventricular Rate:  91 PR Interval:  197 QRS Duration: 122 QT Interval:  391 QTC Calculation: 482 R Axis:   -58 Text Interpretation: Sinus tachycardia Ventricular premature complex Left bundle branch block no prior ECG before today in system. no STEMI Confirmed by Theda Belfast ([id number]) on 07/22/2022 8:43:46 PM  Radiology DG Shoulder Left  Result Date: 07/22/2022 CLINICAL DATA:  Atraumatic left shoulder pain. EXAM: LEFT SHOULDER - 2+ VIEW COMPARISON:  None Available. FINDINGS: There is no evidence of fracture or dislocation. Mild degenerative changes are seen involving the left acromioclavicular joint and left glenohumeral articulation. Soft tissues are unremarkable. IMPRESSION: Mild degenerative changes without evidence of an acute osseous abnormality. Electronically Signed   By: Aram Candela M.D.   On: 07/22/2022 22:19   DG Humerus Left  Result Date: 07/22/2022 CLINICAL DATA:  Atraumatic left shoulder pain. EXAM: LEFT HUMERUS - 2+ VIEW COMPARISON:  None Available. FINDINGS: There is no evidence of fracture or other focal bone lesions. Soft tissues are unremarkable. IMPRESSION: Negative. Electronically Signed   By: Aram Candela M.D.   On: 07/22/2022 22:18   DG Chest 2 View  Result Date:  07/22/2022 CLINICAL DATA:  Left shoulder pain for several hours, initial encounter EXAM: CHEST - 2 VIEW COMPARISON:  09/24/2018 FINDINGS: The heart size and mediastinal contours are within normal limits. Both lungs are clear. The visualized skeletal structures are unremarkable. IMPRESSION: No active cardiopulmonary disease. Electronically Signed   By: Alcide Clever M.D.   On: 07/22/2022 21:28    Procedures Procedures    Medications Ordered in ED Medications  traMADol (ULTRAM) tablet 50 mg (50 mg Oral Given 07/22/22 2154)    ED Course/ Medical Decision Making/ A&P                             Medical Decision Making Amount and/or Complexity of Data Reviewed Labs: ordered. Radiology: ordered.  Risk Prescription drug management.    ARLIS EVERLY is a 77 y.o. female with a past medical history significant for left bundle branch block, pulmonary hypertension, previous SVT, thyroid disease, vertigo, and known left bundle branch block who presents with left arm pains.  According to patient, since about 3:30 PM today she has had pain in her left arm going from her left shoulder down.  She reports it is not felt numb or weak but is hurting significant especially when she moves it.  She denies any chest pain or shortness of breath or palpitations but spoke to a family ember who was concerned about her heart.  She denies any pain going to her back and is not sharp or stabbing in the chest.  She reports is more of a soreness and sharp discomfort in her left shoulder going down.  She reports it does slowly go to her neck but is not hurting her now.  She denies any skin changes or shingles history.  Denies any trauma whatsoever.  Denies any leg pain, leg swelling, fevers, chills, cough, URI symptoms.  Denies nausea, vomiting, constipation, diarrhea, or urinary changes.  Patient reports the pain is 8 out of 10 in severity and she is crying.  On exam, lungs clear and chest is nontender.  I did appreciate a  slight murmur.  Abdomen nontender.  Good pulses in extremities.  Intact sensation and strength in both upper extremities.  She had pain with passive movement of the arm and with any palpation.  Her Spurling was negative and she was not tender in the neck whatsoever.  Exam otherwise unremarkable.  EKG shows no STEMI.  Had a shared decision-making conversation with patient and family about management.  They agree with labs including troponins, chest x-ray, imaging of the left shoulder with a shoulder x-ray and humerus x-ray, and we will also put her shoulder immobilizer as it seems to musculoskeletal.  Will try to rule out a cardiac cause of symptoms however clinically I suspect musculoskeletal pain versus pain before rash with shingles.  If workup reassuring, anticipate she will likely be stable for discharge home with close PCP and outpatient follow-up as well as getting a prescription for Valtrex to fill if rash develops.  Clinically she has no fevers or chills and I have low suspicion for septic arthritis at this time.  Anticipate reassessment after workup.  Patient's workup returned overall reassuring.  Troponin negative, metabolic panel and CBC reassuring.  Shoulder, humerus, and chest x-ray reassuring with evidence of arthritis.  Suspect muscle skeletal pain.  We had a shared decision-making conversation including offering advanced imaging of the neck however given lack of trauma or neck tenderness or pain on exam, we agreed to hold on this.  We will give prescription for Lidoderm patches and some pain medicine that seemed to help and give her a shoulder immobilizer.  She will follow-up with orthopedics and sports medicine.  She understands return precautions and follow-up instructions and was discharged in good condition after workup here did not show evidence of an acute cardiac cause of symptoms which was her primary concern to rule out.          Final Clinical Impression(s) / ED  Diagnoses Final diagnoses:  Acute pain of left shoulder    Rx / DC Orders ED Discharge Orders          Ordered    traMADol (ULTRAM) 50 MG tablet  Every 6 hours PRN        07/22/22 2339    lidocaine (LIDODERM) 5 %  Every 24 hours        07/22/22 2339            Clinical Impression: 1. Acute pain of left shoulder     Disposition: Discharge  Condition: Good  I have discussed the results, Dx and Tx plan with the pt(& family if present). He/she/they expressed understanding and agree(s) with the plan. Discharge instructions discussed at great length. Strict return precautions discussed and pt &/or family have verbalized understanding of the instructions. No further questions at time of discharge.    Discharge Medication List as of 07/22/2022 11:40 PM     START taking these medications   Details  lidocaine (LIDODERM) 5 % Place 1 patch onto the skin daily. Remove & Discard patch within 12 hours or as directed by MD, Starting Sun 07/22/2022, Normal    traMADol (ULTRAM) 50 MG tablet Take 1 tablet (50 mg total) by mouth every 6 (six) hours as needed., Starting Sun 07/22/2022, Normal        Follow Up: Myra Rude, MD [address] [place] [address] [phone]     Marcell Anger, NP [address] [place] [address] [phone]        Brynlynn Walko, Canary Brim, MD 07/23/22  0001  

## 2022-07-22 NOTE — ED Triage Notes (Signed)
Pt began having left shoulder pain that radiates down left arm that began @ 1530.  Denies injury -just watching TV. Pt tried Icy hot a muscle relaxer and heating pad but nothing helped.

## 2022-07-22 NOTE — ED Notes (Signed)
RN reviewed discharge instructions w/ pt and pt's husband and daughter. Pain management, prescriptions, follow up and arm sling instructions reviewed, no further questions.

## 2022-09-01 ENCOUNTER — Other Ambulatory Visit: Payer: Self-pay | Admitting: Cardiology

## 2022-11-23 ENCOUNTER — Other Ambulatory Visit: Payer: Self-pay | Admitting: Cardiology

## 2022-12-07 ENCOUNTER — Other Ambulatory Visit: Payer: Self-pay | Admitting: Cardiology

## 2022-12-12 ENCOUNTER — Ambulatory Visit: Payer: Medicare HMO | Attending: Cardiology | Admitting: Cardiology

## 2022-12-12 ENCOUNTER — Encounter: Payer: Self-pay | Admitting: Cardiology

## 2022-12-12 VITALS — BP 124/80 | HR 76 | Ht 59.0 in | Wt 125.4 lb

## 2022-12-12 DIAGNOSIS — I447 Left bundle-branch block, unspecified: Secondary | ICD-10-CM | POA: Diagnosis not present

## 2022-12-12 DIAGNOSIS — I493 Ventricular premature depolarization: Secondary | ICD-10-CM | POA: Diagnosis not present

## 2022-12-12 DIAGNOSIS — I471 Supraventricular tachycardia, unspecified: Secondary | ICD-10-CM

## 2022-12-12 DIAGNOSIS — I358 Other nonrheumatic aortic valve disorders: Secondary | ICD-10-CM

## 2022-12-12 DIAGNOSIS — I272 Pulmonary hypertension, unspecified: Secondary | ICD-10-CM | POA: Diagnosis not present

## 2022-12-12 DIAGNOSIS — R011 Cardiac murmur, unspecified: Secondary | ICD-10-CM

## 2022-12-12 NOTE — Patient Instructions (Signed)
Medication Instructions:  Your physician recommends that you continue on your current medications as directed. Please refer to the Current Medication list given to you today.  *If you need a refill on your cardiac medications before your next appointment, please call your pharmacy*   Lab Work: None.  If you have labs (blood work) drawn today and your tests are completely normal, you will receive your results only by: MyChart Message (if you have MyChart) OR A paper copy in the mail If you have any lab test that is abnormal or we need to change your treatment, we will call you to review the results.   Testing/Procedures: None.   Follow-Up:   Your next appointment:   1 year(s)  Provider:   Traci Turner, MD     

## 2022-12-12 NOTE — Progress Notes (Signed)
Cardiology Office Note:    Date:  12/12/2022   ID:  Adrienne Gonzales, DOB Oct 19, 1945, MRN [id number]  PCP:  Marcell Anger, NP  Cardiologist:  Armanda Magic, MD    Referring MD: Marcell Anger, NP   Chief Complaint  Patient presents with   Follow-up    PVCs, SVT, pulmonary hypertension, left bundle branch block, MR    History of Present Illness:    MELENDA Gonzales is a 77 y.o. female with a hx of asthma, moderate pulmonary HTN with PASP of 45-72mmHg in the past but echo 08/2014 showed resolution of pulmonary HTN (this was most likely secondary to diastolic dysfunction), PVC's, chronic LBBB and PAC's and SVT up to 11 beats at 191bpm on Holter monitor, hypertension and daytime sleepiness but no OSA on sleep study.    She is here today for followup and is doing well.  She denies any chest pain or pressure, SOB, DOE, PND, orthopnea, LE edema, palpitations or syncope. She has had a few spells of dizziness but only lasts about 2 seconds and they are very rare. She is compliant with her meds and is tolerating meds with no SE.     Past Medical History:  Diagnosis Date   Asthma    H/O: hysterectomy    Heart murmur    History of vertigo    HLD (hyperlipidemia)    LBBB (left bundle branch block)    PAC (premature atrial contraction)    Pulmonary HTN (HCC)    by echo 2015 moderate PASP 45-45mmHg.  Resolved on repeat echo 2016   PVC (premature ventricular contraction)    SVT (supraventricular tachycardia)    Thyroid disease    synthroid    History reviewed. No pertinent surgical history.  Current Medications: Current Meds  Medication Sig   acetaminophen (TYLENOL) 325 MG tablet Take by mouth.   albuterol (PROVENTIL HFA;VENTOLIN HFA) 108 (90 BASE) MCG/ACT inhaler Inhale 1-2 puffs into the lungs every 6 (six) hours as needed for wheezing or shortness of breath.   azelastine (ASTELIN) 0.1 % nasal spray Place 2 sprays into both nostrils as needed for congestion.    budesonide-formoterol (SYMBICORT) 80-4.5 MCG/ACT inhaler Inhale 2 puffs into the lungs 2 (two) times daily.   cetirizine (ZYRTEC) 10 MG tablet Take 10 mg by mouth daily.   Coenzyme Q10 (CO Q-10 PO) Take 1 tablet by mouth daily.   desonide (DESOWEN) 0.05 % cream Apply 1 application topically 2 (two) times daily.   diltiazem (CARTIA XT) 180 MG 24 hr capsule Take 1 capsule (180 mg total) by mouth daily.   FERROCITE 324 MG TABS tablet Take 1 tablet by mouth 2 (two) times daily.   ferrous fumarate (HEMOCYTE - 106 MG FE) 325 (106 FE) MG TABS tablet Take 1 tablet by mouth daily.   fluticasone (FLONASE) 50 MCG/ACT nasal spray Place into the nose.   Fluticasone-Salmeterol (ADVAIR) 500-50 MCG/DOSE AEPB Inhale 1 puff into the lungs 2 (two) times daily.   ibuprofen (ADVIL,MOTRIN) 200 MG tablet Take 200 mg by mouth every 6 (six) hours as needed for fever, headache, mild pain, moderate pain or cramping.   levothyroxine (SYNTHROID) 88 MCG tablet Take 88 mcg by mouth daily before breakfast.   lidocaine (LIDODERM) 5 % Place 1 patch onto the skin daily. Remove & Discard patch within 12 hours or as directed by MD   meclizine (ANTIVERT) 25 MG tablet Take by mouth.   meloxicam (MOBIC) 7.5 MG tablet Take 7.5 mg by mouth  2 (two) times daily.   mometasone (NASONEX) 50 MCG/ACT nasal spray Place 2 sprays into the nose daily.   montelukast (SINGULAIR) 10 MG tablet Take 10 mg by mouth at bedtime.   pantoprazole (PROTONIX) 40 MG tablet Take 40 mg by mouth daily.   pravastatin (PRAVACHOL) 40 MG tablet Take by mouth.   Psyllium (SM FIBER) 48.57 % POWD    traMADol (ULTRAM) 50 MG tablet Take 1 tablet (50 mg total) by mouth every 6 (six) hours as needed.   traZODone (DESYREL) 50 MG tablet Take 50 mg by mouth as needed for sleep.     Allergies:   Sulfa antibiotics, Percocet [oxycodone-acetaminophen], Barbiturates, Ciprofloxacin, Codeine, Penicillins, and Propoxyphene   Social History   Socioeconomic History   Marital  status: Married    Spouse name: Not on file   Number of children: Not on file   Years of education: Not on file   Highest education level: Not on file  Occupational History   Not on file  Tobacco Use   Smoking status: Never   Smokeless tobacco: Never  Vaping Use   Vaping status: Never Used  Substance and Sexual Activity   Alcohol use: No   Drug use: No   Sexual activity: Not on file  Other Topics Concern   Not on file  Social History Narrative   Not on file   Social Determinants of Health   Financial Resource Strain: Not on file  Food Insecurity: Not on file  Transportation Needs: No Transportation Needs (05/11/2022)   Received from [facility]   [facility] - Transportation    Lack of Transportation (Medical): No    Lack of Transportation (Non-Medical): No  Physical Activity: Not on file  Stress: Not on file  Social Connections: Not on file     Family History: The patient's family history includes Cancer in her mother; Heart attack in her father.  ROS:   Please see the history of present illness.    ROS  All other systems reviewed and negative.   EKGs/Labs/Other Studies Reviewed:    The following studies were reviewed today:  EKG Interpretation Date/Time:  Wednesday December 12 2022 09:56:34 EDT Ventricular Rate:  76 PR Interval:  204 QRS Duration:  122 QT Interval:  394 QTC Calculation: 443 R Axis:   -51  Text Interpretation: Normal sinus rhythm Left axis deviation Left bundle branch block When compared with ECG of 22-Jul-2022 20:40, PREVIOUS ECG IS PRESENT Confirmed by Armanda Magic ([id number]) on 12/12/2022 10:05:20 AM    Recent Labs: 07/22/2022: BUN 17; Creatinine, Ser 0.55; Hemoglobin 12.1; Platelets 363; Potassium 3.9; Sodium 139   Recent Lipid Panel    Component Value Date/Time   CHOL 145 11/02/2013 0950   TRIG 58.0 11/02/2013 0950   HDL 63.00 11/02/2013 0950   CHOLHDL 2 11/02/2013 0950   VLDL 11.6 11/02/2013 0950   LDLCALC 70  11/02/2013 0950    Physical Exam:    VS:  BP 124/80 (BP Location: Left Arm, Patient Position: Sitting, Cuff Size: Normal)   Pulse 76   Ht 4\' 11"  (1.499 m)   Wt 125 lb 6.4 oz (56.9 kg)   SpO2 95%   BMI 25.33 kg/m     Wt Readings from Last 3 Encounters:  12/12/22 125 lb 6.4 oz (56.9 kg)  07/21/21 122 lb 3.2 oz (55.4 kg)  06/02/20 122 lb 12.8 oz (55.7 kg)    GEN: Well nourished, well developed in no acute distress HEENT: Normal  NECK: No JVD; No carotid bruits LYMPHATICS: No lymphadenopathy CARDIAC:RRR, no  rubs, gallops 2/6 SM at RUSB RESPIRATORY:  Clear to auscultation without rales, wheezing or rhonchi  ABDOMEN: Soft, non-tender, non-distended MUSCULOSKELETAL:  No edema; No deformity  SKIN: Warm and dry NEUROLOGIC:  Alert and oriented x 3 PSYCHIATRIC:  Normal affect   ASSESSMENT:    1. PVC (premature ventricular contraction)   2. SVT (supraventricular tachycardia)   3. LBBB (left bundle branch block)   4. Pulmonary HTN (HCC)   5. Aortic valve sclerosis   6. Heart murmur    PLAN:    In order of problems listed above:  1.  PVCs -She is made a normal sinus rhythm and denies any significant palpitations since I saw her last.   -Continue prescription drug management Cardizem CD 180 mg daily with as needed refills  2.  SVT -She did not any further tachyarrhythmias consistent with what she had with SVT in the past -Continue prescription drug management Cardizem CD 180 mg daily with as needed refills  3.  Pulmonary HTN -resolved by echo 2016  4.  LBBB -coronary artery calcium score was 0 with no CAD on coronary CTA 2015 -She denies any anginal symptoms  5.  Leg cramps -LE arterial dopplers 05/2018 were normal  6.  Heart murmur/AVSC/MR -she has a systolic murmur with hx of AVSC but echo 2020 -2D echo 08/03/2021 showed mild MR and AVSC with no AS or AR  Followup with me in 1 year   Medication Adjustments/Labs and Tests Ordered: Current medicines are reviewed at  length with the patient today.  Concerns regarding medicines are outlined above.  Orders Placed This Encounter  Procedures   EKG 12-Lead   No orders of the defined types were placed in this encounter.   Signed, Armanda Magic, MD  12/12/2022 10:08 AM    [facility]

## 2023-03-01 ENCOUNTER — Other Ambulatory Visit: Payer: Self-pay | Admitting: Cardiology

## 2023-11-21 ENCOUNTER — Other Ambulatory Visit: Payer: Self-pay | Admitting: Cardiology

## 2023-12-10 ENCOUNTER — Ambulatory Visit (HOSPITAL_COMMUNITY)
Admission: RE | Admit: 2023-12-10 | Discharge: 2023-12-10 | Disposition: A | Discharge status: Home / Self Care | Payer: Self-pay | Source: Ambulatory Visit | Attending: Cardiovascular Disease | Admitting: Cardiovascular Disease

## 2023-12-10 ENCOUNTER — Ambulatory Visit: Payer: Self-pay | Admitting: Cardiology

## 2023-12-10 ENCOUNTER — Encounter: Payer: Self-pay | Admitting: Cardiology

## 2023-12-10 ENCOUNTER — Ambulatory Visit: Attending: Cardiology | Admitting: Cardiology

## 2023-12-10 VITALS — BP 112/58 | HR 81 | Ht 59.0 in | Wt 121.0 lb

## 2023-12-10 DIAGNOSIS — I358 Other nonrheumatic aortic valve disorders: Secondary | ICD-10-CM | POA: Insufficient documentation

## 2023-12-10 DIAGNOSIS — I272 Pulmonary hypertension, unspecified: Secondary | ICD-10-CM | POA: Diagnosis not present

## 2023-12-10 DIAGNOSIS — I447 Left bundle-branch block, unspecified: Secondary | ICD-10-CM | POA: Insufficient documentation

## 2023-12-10 DIAGNOSIS — I059 Rheumatic mitral valve disease, unspecified: Secondary | ICD-10-CM

## 2023-12-10 DIAGNOSIS — I493 Ventricular premature depolarization: Secondary | ICD-10-CM

## 2023-12-10 DIAGNOSIS — R011 Cardiac murmur, unspecified: Secondary | ICD-10-CM

## 2023-12-10 DIAGNOSIS — I471 Supraventricular tachycardia, unspecified: Secondary | ICD-10-CM | POA: Diagnosis not present

## 2023-12-10 NOTE — Addendum Note (Signed)
 Addended by: RICHIE ADRIEN ORN on: 12/10/2023 11:22 AM   Modules accepted: Orders

## 2023-12-10 NOTE — Patient Instructions (Addendum)
     Testing/Procedures:  CORONARY CALCIUM  SCORING CT SCAN AT THE [facility]  Your physician has requested that you have an echocardiogram. Echocardiography is a painless test that uses sound waves to create images of your heart. It provides your doctor with information about the size and shape of your heart and how well your heart's chambers and valves are working. This procedure takes approximately one hour. There are no restrictions for this procedure. Please do NOT wear cologne, perfume, aftershave, or lotions (deodorant is allowed). Please arrive 15 minutes prior to your appointment time.  Please note: We ask at that you not bring children with you during ultrasound (echo/ vascular) testing. Due to room size and safety concerns, children are not allowed in the ultrasound rooms during exams. Our front office staff cannot provide observation of children in our lobby area while testing is being conducted. An adult accompanying a patient to their appointment will only be allowed in the ultrasound room at the discretion of the ultrasound technician under special circumstances. We apologize for any inconvenience. [facility] [address] IN APRIL 2026  Follow-Up: At [facility], you and your health needs are our priority.  As part of our continuing mission to provide you with exceptional heart care, our providers are all part of one team.  This team includes your primary Cardiologist (physician) and Advanced Practice Providers or APPs (Physician Assistants and Nurse Practitioners) who all work together to provide you with the care you need, when you need it.  Your next appointment:   12 month(s)  Provider:   Wilbert Bihari, MD

## 2023-12-10 NOTE — Progress Notes (Signed)
 Cardiology Office Note:    Date:  12/10/2023   ID:  Adrienne Gonzales, DOB 05/12/1945, MRN [id number]  PCP:  Gretta Coolidge SQUIBB, NP  Cardiologist:  Wilbert Bihari, MD    Referring MD: Gretta Coolidge SQUIBB, NP   Chief Complaint  Patient presents with   Follow-up    PVCs, SVT, LBBB, MR, AVSC    History of Present Illness:    Adrienne Gonzales is a 78 y.o. female with a hx of asthma, moderate pulmonary HTN with PASP of 45-58mmHg in the past but echo 08/2014 showed resolution of pulmonary HTN (this was most likely secondary to diastolic dysfunction), PVC's, chronic LBBB and PAC's and SVT up to 11 beats at 191bpm on Holter monitor, hypertension and daytime sleepiness but no OSA on sleep study.    She is here today for followup and is doing well.  She denies any CP or pressure, SOB, DOE,  PND, orthopnea, dizziness, palpitations or syncope.  Occasionally she will have some mild LE edema that resolves but the next morning.   Past Medical History:  Diagnosis Date   Asthma    H/O: hysterectomy    Heart murmur    History of vertigo    HLD (hyperlipidemia)    LBBB (left bundle branch block)    PAC (premature atrial contraction)    Pulmonary HTN (HCC)    by echo 2015 moderate PASP 45-81mmHg.  Resolved on repeat echo 2016   PVC (premature ventricular contraction)    SVT (supraventricular tachycardia) (HCC)    Thyroid disease    synthroid    History reviewed. No pertinent surgical history.  Current Medications: Current Meds  Medication Sig   acetaminophen (TYLENOL) 325 MG tablet Take by mouth.   albuterol (PROVENTIL HFA;VENTOLIN HFA) 108 (90 BASE) MCG/ACT inhaler Inhale 1-2 puffs into the lungs every 6 (six) hours as needed for wheezing or shortness of breath.   azelastine (ASTELIN) 0.1 % nasal spray Place 2 sprays into both nostrils as needed for congestion.   budesonide-formoterol (SYMBICORT) 80-4.5 MCG/ACT inhaler Inhale 2 puffs into the lungs 2 (two) times daily.   cetirizine (ZYRTEC) 10  MG tablet Take 10 mg by mouth daily.   Coenzyme Q10 (CO Q-10 PO) Take 1 tablet by mouth daily.   desonide (DESOWEN) 0.05 % cream Apply 1 application topically 2 (two) times daily.   diltiazem  (CARTIA  XT) 180 MG 24 hr capsule TAKE ONE CAPSULE BY MOUTH EVERY DAY FOR BLOOD PRESSURE   doxycycline (PERIOSTAT) 20 MG tablet Take 20 mg by mouth 2 (two) times daily.   FERROCITE 324 MG TABS tablet Take 1 tablet by mouth 2 (two) times daily.   fluticasone (FLONASE) 50 MCG/ACT nasal spray Place into the nose.   Fluticasone-Salmeterol (ADVAIR) 500-50 MCG/DOSE AEPB Inhale 1 puff into the lungs 2 (two) times daily.   folic acid (FOLVITE) 1 MG tablet Take 1 mg by mouth daily.   ibuprofen (ADVIL,MOTRIN) 200 MG tablet Take 200 mg by mouth every 6 (six) hours as needed for fever, headache, mild pain, moderate pain or cramping.   latanoprost (XALATAN) 0.005 % ophthalmic solution Apply 1 drop to eye.   levothyroxine (SYNTHROID) 88 MCG tablet Take 88 mcg by mouth daily before breakfast.   meclizine (ANTIVERT) 25 MG tablet Take by mouth.   methotrexate (RHEUMATREX) 2.5 MG tablet Take 10 mg by mouth once a week.   mometasone (NASONEX) 50 MCG/ACT nasal spray Place 2 sprays into the nose daily.   montelukast (SINGULAIR) 10 MG tablet  Take 10 mg by mouth at bedtime.   ofloxacin (OCUFLOX) 0.3 % ophthalmic solution PLACE 1 DROP IN BOTH EYES TWICE DAILY   pantoprazole (PROTONIX) 40 MG tablet Take 40 mg by mouth daily.   pravastatin (PRAVACHOL) 40 MG tablet Take by mouth.   Psyllium (SM FIBER) 48.57 % POWD    rOPINIRole (REQUIP) 0.25 MG tablet Take 0.25-0.5 mg by mouth at bedtime.   traMADol  (ULTRAM ) 50 MG tablet Take 1 tablet (50 mg total) by mouth every 6 (six) hours as needed.   traZODone (DESYREL) 50 MG tablet Take 50 mg by mouth as needed for sleep.   [DISCONTINUED] ferrous fumarate (HEMOCYTE - 106 MG FE) 325 (106 FE) MG TABS tablet Take 1 tablet by mouth daily.   [DISCONTINUED] lidocaine  (LIDODERM ) 5 % Place 1 patch  onto the skin daily. Remove & Discard patch within 12 hours or as directed by MD   [DISCONTINUED] meloxicam (MOBIC) 7.5 MG tablet Take 7.5 mg by mouth 2 (two) times daily.     Allergies:   Sulfa antibiotics, Percocet [oxycodone-acetaminophen], Barbiturates, Ciprofloxacin, Codeine, Penicillins, and Propoxyphene   Social History   Socioeconomic History   Marital status: Married    Spouse name: Not on file   Number of children: Not on file   Years of education: Not on file   Highest education level: Not on file  Occupational History   Not on file  Tobacco Use   Smoking status: Never   Smokeless tobacco: Never  Vaping Use   Vaping status: Never Used  Substance and Sexual Activity   Alcohol use: No   Drug use: No   Sexual activity: Not on file  Other Topics Concern   Not on file  Social History Narrative   Not on file   Social Drivers of Health   Financial Resource Strain: Not on file  Food Insecurity: No Food Insecurity (07/04/2023)   Received from [facility]   Hunger Vital Sign    Within the past 12 months, you worried that your food would run out before you got the money to buy more.: Never true    Within the past 12 months, the food you bought just didn't last and you didn't have money to get more.: Never true  Transportation Needs: No Transportation Needs (07/04/2023)   Received from [facility] - Transportation    Lack of Transportation (Medical): No    Lack of Transportation (Non-Medical): No  Physical Activity: Not on file  Stress: Not on file  Social Connections: Not on file     Family History: The patient's family history includes Cancer in her mother; Heart attack in her father.  ROS:   Please see the history of present illness.    ROS  All other systems reviewed and negative.   EKGs/Labs/Other Studies Reviewed:    The following studies were reviewed today: EKG Interpretation Date/Time:  Tuesday December 10 2023 10:48:59  EDT Ventricular Rate:  81 PR Interval:  214 QRS Duration:  120 QT Interval:  392 QTC Calculation: 455 R Axis:   -42  Text Interpretation: Sinus rhythm with 1st degree A-V block Left axis deviation Left bundle branch block When compared with ECG of 12-Dec-2022 09:56, No significant change since last tracing Confirmed by Shlomo Corning ([id number]) on 12/10/2023 10:56:52 AM    Recent Labs: No results found for requested labs within last 365 days.   Recent Lipid Panel    Component Value Date/Time   CHOL 145  11/02/2013 0950   TRIG 58.0 11/02/2013 0950   HDL 63.00 11/02/2013 0950   CHOLHDL 2 11/02/2013 0950   VLDL 11.6 11/02/2013 0950   LDLCALC 70 11/02/2013 0950  EKG Interpretation Date/Time:  Tuesday December 10 2023 10:48:59 EDT Ventricular Rate:  81 PR Interval:  214 QRS Duration:  120 QT Interval:  392 QTC Calculation: 455 R Axis:   -42  Text Interpretation: Sinus rhythm with 1st degree A-V block Left axis deviation Left bundle branch block When compared with ECG of 12-Dec-2022 09:56, No significant change since last tracing Confirmed by Shlomo Corning ([id number]) on 12/10/2023 10:56:52 AM    Physical Exam:    VS:  BP (!) 112/58   Pulse 81   Ht 4' 11 (1.499 m)   Wt 121 lb (54.9 kg)   SpO2 98%   BMI 24.44 kg/m     Wt Readings from Last 3 Encounters:  12/10/23 121 lb (54.9 kg)  12/12/22 125 lb 6.4 oz (56.9 kg)  07/21/21 122 lb 3.2 oz (55.4 kg)    GEN: Well nourished, well developed in no acute distress HEENT: Normal NECK: No JVD; No carotid bruits LYMPHATICS: No lymphadenopathy CARDIAC:RRR, no rubs, gallops 1/6 SM at RUSB to LLSB RESPIRATORY:  Clear to auscultation without rales, wheezing or rhonchi  ABDOMEN: Soft, non-tender, non-distended MUSCULOSKELETAL:  No edema; No deformity  SKIN: Warm and dry NEUROLOGIC:  Alert and oriented x 3 PSYCHIATRIC:  Normal affect  ASSESSMENT:    1. PVC (premature ventricular contraction)   2. SVT (supraventricular tachycardia) (HCC)    3. Pulmonary HTN (HCC)   4. LBBB (left bundle branch block)   5. Heart murmur    PLAN:    In order of problems listed above:  PVCs SVT -She denies any recent palpitations and is maintaining NSR on EKG -continue Diltiazem  XT 180mg  daily with PRN refills  LBBB -coronary artery calcium  score was 0 with no CAD on coronary CTA 2015 -denies any anginal sx -repeat coronary Ca score  Heart murmur/AVSC/MR -she has a systolic murmur with hx of AVSC but echo 2020 -2D echo 08/03/2021 showed mild MR and AVSC with no AS or AR -repeat echo 07/2024 for MR   Followup with me in 1 year   Medication Adjustments/Labs and Tests Ordered: Current medicines are reviewed at length with the patient today.  Concerns regarding medicines are outlined above.  Orders Placed This Encounter  Procedures   EKG 12-Lead   No orders of the defined types were placed in this encounter.   Signed, Corning Shlomo, MD  12/10/2023 11:14 AM    [facility]

## 2023-12-27 NOTE — Telephone Encounter (Signed)
-----   Message from Adrienne Gonzales sent at 12/11/2023 10:44 PM EDT ----- Coronary Ca score is 0 ----- Message ----- From: Interface, Rad Results In Sent: 12/10/2023   3:47 PM EDT To: Adrienne JONELLE Bihari, MD

## 2023-12-27 NOTE — Telephone Encounter (Signed)
 Call to patient to discuss coronary calcium  score CT results. No answer. No updated DPR on file, left VM with no identifiers asking recipient to call [facility] at our phone #.

## 2023-12-30 ENCOUNTER — Telehealth: Payer: Self-pay | Admitting: Cardiology

## 2023-12-30 NOTE — Telephone Encounter (Signed)
Patient returned call for echo results.  

## 2023-12-30 NOTE — Telephone Encounter (Signed)
 Left message with call back number- asking pt to return call

## 2023-12-31 NOTE — Telephone Encounter (Signed)
 Spoke with pt regarding her calcium  score results from Dr. Shlomo. Pt verbalized understanding. All questions if any were answered.

## 2023-12-31 NOTE — Telephone Encounter (Signed)
 Pt was returning nurse call regarding results and is requesting a callback. Please advise

## 2024-07-16 ENCOUNTER — Other Ambulatory Visit (HOSPITAL_COMMUNITY)
